# Patient Record
Sex: Male | Born: 1980
Health system: Southern US, Community
[De-identification: ages and names within clinical notes are randomized; demographics above are authoritative.]

## PROBLEM LIST (undated history)

## (undated) DIAGNOSIS — K219 Gastro-esophageal reflux disease without esophagitis: Secondary | ICD-10-CM

## (undated) DIAGNOSIS — J309 Allergic rhinitis, unspecified: Secondary | ICD-10-CM

## (undated) DIAGNOSIS — K601 Chronic anal fissure: Secondary | ICD-10-CM

## (undated) DIAGNOSIS — R0600 Dyspnea, unspecified: Secondary | ICD-10-CM

## (undated) DIAGNOSIS — J189 Pneumonia, unspecified organism: Secondary | ICD-10-CM

## (undated) DIAGNOSIS — Z8489 Family history of other specified conditions: Secondary | ICD-10-CM

## (undated) DIAGNOSIS — Z87898 Personal history of other specified conditions: Secondary | ICD-10-CM

## (undated) HISTORY — PX: ADENOIDECTOMY: SUR15

## (undated) HISTORY — PX: TONSILLECTOMY: SUR1361

## (undated) HISTORY — PX: COLONOSCOPY: SHX174

---

## 2018-10-23 DIAGNOSIS — Z713 Dietary counseling and surveillance: Secondary | ICD-10-CM | POA: Diagnosis not present

## 2019-03-03 DIAGNOSIS — Z8701 Personal history of pneumonia (recurrent): Secondary | ICD-10-CM

## 2019-03-03 HISTORY — DX: Personal history of pneumonia (recurrent): Z87.01

## 2019-03-26 ENCOUNTER — Telehealth: Payer: Self-pay | Admitting: Family

## 2019-03-26 ENCOUNTER — Other Ambulatory Visit: Payer: Self-pay

## 2019-03-26 DIAGNOSIS — Z20822 Contact with and (suspected) exposure to covid-19: Secondary | ICD-10-CM

## 2019-03-26 DIAGNOSIS — R6889 Other general symptoms and signs: Secondary | ICD-10-CM | POA: Diagnosis not present

## 2019-03-26 MED ORDER — PROMETHAZINE-DM 6.25-15 MG/5ML PO SYRP: 5.0000 mL | ORAL_SOLUTION | Freq: Four times a day (QID) | ORAL | 0 refills | Status: DC | PRN

## 2019-03-26 NOTE — Progress Notes (Signed)
E-Visit for Corona Virus Screening   Your current symptoms could be consistent with the coronavirus.  Many health care providers can now test patients at their office but not all are.  [facility] has multiple testing sites. For information on our COVID testing locations and hours go to [email or web address]  Please quarantine yourself while awaiting your test results.  We are enrolling you in our [place] for [place] . Daily you will receive a questionnaire within the [place] website. Our COVID 19 response team willl be monitoriing your responses daily.    COVID-19 is a respiratory illness with symptoms that are similar to the flu. Symptoms are typically mild to moderate, but there have been cases of severe illness and death due to the virus. The following symptoms may appear 2-14 days after exposure: . Fever . Cough . Shortness of breath or difficulty breathing . Chills . Repeated shaking with chills . Muscle pain . Headache . Sore throat . New loss of taste or smell . Fatigue . Congestion or runny nose . Nausea or vomiting . Diarrhea  It is vitally important that if you feel that you have an infection such as this virus or any other virus that you stay home and away from places where you may spread it to others.  You should self-quarantine for 14 days if you have symptoms that could potentially be coronavirus or have been in close contact a with a person diagnosed with COVID-19 within the last 2 weeks. You should avoid contact with people age 67 and older.   You should wear a mask or cloth face covering over your nose and mouth if you must be around other people or animals, including pets (even at home). Try to stay at least 6 feet away from other people. This will protect the people around you.  You can use medication such as A prescription cough medication called Phenergan DM 6.25 mg/15 mg. You make take one teaspoon / 5 ml every 4-6 hours as  needed for cough  You may also take acetaminophen (Tylenol) as needed for fever.   Reduce your risk of any infection by using the same precautions used for avoiding the common cold or flu:  Marland Kitchen Wash your hands often with soap and warm water for at least 20 seconds.  If soap and water are not readily available, use an alcohol-based hand sanitizer with at least 60% alcohol.  . If coughing or sneezing, cover your mouth and nose by coughing or sneezing into the elbow areas of your shirt or coat, into a tissue or into your sleeve (not your hands). . Avoid shaking hands with others and consider head nods or verbal greetings only. . Avoid touching your eyes, nose, or mouth with unwashed hands.  . Avoid close contact with people who are sick. . Avoid places or events with large numbers of people in one location, like concerts or sporting events. . Carefully consider travel plans you have or are making. . If you are planning any travel outside or inside the [place], visit the CDC's Travelers' Health webpage for the latest health notices. . If you have some symptoms but not all symptoms, continue to monitor at home and seek medical attention if your symptoms worsen. . If you are having a medical emergency, call 911.  HOME CARE . Only take medications as instructed by your medical team. . Drink plenty of fluids and get plenty of rest. . A steam or ultrasonic humidifier can help if you  have congestion.   GET HELP RIGHT AWAY IF YOU HAVE EMERGENCY WARNING SIGNS** FOR COVID-19. If you or someone is showing any of these signs seek emergency medical care immediately. Call 911 or proceed to your closest emergency facility if: . You develop worsening high fever. . Trouble breathing . Bluish lips or face . Persistent pain or pressure in the chest . New confusion . Inability to wake or stay awake . You cough up blood. . Your symptoms become more severe  **This list is not all possible symptoms. Contact your  medical provider for any symptoms that are sever or concerning to you.   MAKE SURE YOU   Understand these instructions.  Will watch your condition.  Will get help right away if you are not doing well or get worse.  Your e-visit answers were reviewed by a board certified advanced clinical practitioner to complete your personal care plan.  Depending on the condition, your plan could have included both over the counter or prescription medications.  If there is a problem please reply once you have received a response from your provider.  Your safety is important to [place].  If you have drug allergies check your prescription carefully.    You can use MyChart to ask questions about today's visit, request a non-urgent call back, or ask for a work or school excuse for 24 hours related to this e-Visit. If it has been greater than 24 hours you will need to follow up with your provider, or enter a new e-Visit to address those concerns. You will get an e-mail in the next two days asking about your experience.  I hope that your e-visit has been valuable and will speed your recovery. Thank you for using e-visits.   Greater than 5 minutes, yet less than 10 minutes of time have been spent researching, coordinating, and implementing care for this patient today.  Thank you for the details you included in the comment boxes. Those details are very helpful in determining the best course of treatment for you and help [place] to provide the best care.

## 2019-03-28 LAB — NOVEL CORONAVIRUS, NAA: SARS-CoV-2, NAA: NOT DETECTED

## 2019-03-31 ENCOUNTER — Other Ambulatory Visit: Payer: Self-pay

## 2019-03-31 ENCOUNTER — Ambulatory Visit
Admission: EM | Admit: 2019-03-31 | Discharge: 2019-03-31 | Disposition: A | Discharge status: Home / Self Care | Payer: BC Managed Care – PPO | Attending: Emergency Medicine | Admitting: Emergency Medicine

## 2019-03-31 ENCOUNTER — Ambulatory Visit (INDEPENDENT_AMBULATORY_CARE_PROVIDER_SITE_OTHER): Payer: BC Managed Care – PPO

## 2019-03-31 DIAGNOSIS — R0602 Shortness of breath: Secondary | ICD-10-CM | POA: Diagnosis not present

## 2019-03-31 DIAGNOSIS — R918 Other nonspecific abnormal finding of lung field: Secondary | ICD-10-CM | POA: Diagnosis not present

## 2019-03-31 DIAGNOSIS — R05 Cough: Secondary | ICD-10-CM

## 2019-03-31 DIAGNOSIS — J22 Unspecified acute lower respiratory infection: Secondary | ICD-10-CM | POA: Diagnosis not present

## 2019-03-31 DIAGNOSIS — R6889 Other general symptoms and signs: Secondary | ICD-10-CM | POA: Diagnosis not present

## 2019-03-31 DIAGNOSIS — Z20828 Contact with and (suspected) exposure to other viral communicable diseases: Secondary | ICD-10-CM

## 2019-03-31 DIAGNOSIS — J3489 Other specified disorders of nose and nasal sinuses: Secondary | ICD-10-CM

## 2019-03-31 DIAGNOSIS — Z20822 Contact with and (suspected) exposure to covid-19: Secondary | ICD-10-CM

## 2019-03-31 MED ORDER — BENZONATATE 100 MG PO CAPS: 100.0000 mg | ORAL_CAPSULE | Freq: Three times a day (TID) | ORAL | 0 refills | Status: DC

## 2019-03-31 MED ORDER — AMOXICILLIN 500 MG PO CAPS: 1000.0000 mg | ORAL_CAPSULE | Freq: Three times a day (TID) | ORAL | 0 refills | Status: AC

## 2019-03-31 MED ORDER — AZITHROMYCIN 250 MG PO TABS: 250.0000 mg | ORAL_TABLET | Freq: Every day | ORAL | 0 refills | Status: DC

## 2019-03-31 NOTE — Discharge Instructions (Addendum)
COVID testing ordered.  It will take approximately 5-7 days for results to return.  Chest x-ray concerning for pneumonia.  Amoxicillin and azithromycin prescribed.  Take as directed and to completion.  Radiologist also recommends repeat chest x-ray in 1-2 days to assess for airspace disease.    In the meantime: You should remain isolated in your home for 10 days from symptom onset AND greater than 72 hours after symptoms resolution (absence of fever without the use of fever-reducing medication and improvement in respiratory symptoms), whichever is longer Get plenty of rest and push fluids Tessalon Perles prescribed for cough Continue with promethazine as prescribed for relief of nighttime cough Use OTC zyrtec for nasal congestion, runny nose, and/or sore throat Flonase prescribed for nasal congestion and runny nose Use medications daily for symptom relief Use OTC medications like ibuprofen or tylenol as needed fever or pain Call or go to the ED if you have any new or worsening symptoms such as fever, worsening cough, shortness of breath, chest tightness, chest pain, turning blue, changes in mental status, etc..Marland Kitchen

## 2019-03-31 NOTE — ED Provider Notes (Signed)
[place]   [id number] 03/31/19 Arrival Time: 3546   CC: URI symptoms   SUBJECTIVE: History from: patient.  Jimi Schappert is a 38 y.o. male who presents with sinus congestion x 10 days, and intermittent dry cough x 2-3 days.  Denies sick exposure to COVID, flu or strep.  Admits to recent travel to [place], via car, for a funeral.  Denies sick contacts at the funeral or close contacts with similar symptoms.  Had an e-visit on 03/26/2019 and prescribed promethazine, which helps him sleep.  Negative COVID at that time.  Denies aggravating factors.  Reports previous symptoms in the past with pneumonia.  Reports fever with tmax of 101 for 4-5 days, and chest congestion with possible wheezes.   Denies fever, chills, fatigue, rhinorrhea, sore throat, SOB, chest pain, nausea, changes in bowel or bladder habits.    Denies tobacco use.    ROS: As per HPI.  All other pertinent ROS negative.     History reviewed. No pertinent past medical history. History reviewed. No pertinent surgical history. No Known Allergies No current facility-administered medications on file prior to encounter.    Current Outpatient Medications on File Prior to Encounter  Medication Sig Dispense Refill  . promethazine-dextromethorphan (PROMETHAZINE-DM) 6.25-15 MG/5ML syrup Take 5 mLs by mouth 4 (four) times daily as needed. 118 mL 0   Social History   Socioeconomic History  . Marital status: Married    Spouse name: Not on file  . Number of children: Not on file  . Years of education: Not on file  . Highest education level: Not on file  Occupational History  . Not on file  Social Needs  . Financial resource strain: Not on file  . Food insecurity    Worry: Not on file    Inability: Not on file  . Transportation needs    Medical: Not on file    Non-medical: Not on file  Tobacco Use  . Smoking status: Not on file  Substance and Sexual Activity  . Alcohol use: Not on file  . Drug use: Not on  file  . Sexual activity: Not on file  Lifestyle  . Physical activity    Days per week: Not on file    Minutes per session: Not on file  . Stress: Not on file  Relationships  . Social Herbalist on phone: Not on file    Gets together: Not on file    Attends religious service: Not on file    Active member of club or organization: Not on file    Attends meetings of clubs or organizations: Not on file    Relationship status: Not on file  . Intimate partner violence    Fear of current or ex partner: Not on file    Emotionally abused: Not on file    Physically abused: Not on file    Forced sexual activity: Not on file  Other Topics Concern  . Not on file  Social History Narrative  . Not on file   History reviewed. No pertinent family history.  OBJECTIVE:  Vitals:   03/31/19 1411  BP: 136/83  Pulse: 98  Resp: 20  Temp: 98.3 F (36.8 C)  SpO2: 94%    General appearance: alert; appears mildly fatigued, but nontoxic; speaking in full sentences and tolerating own secretions HEENT: NCAT; Ears: EACs clear, TMs pearly gray; Eyes: PERRL.  EOM grossly intact. Nose: nares patent without rhinorrhea, Throat: oropharynx clear, tonsils non erythematous  or enlarged, uvula midline  Neck: supple without LAD Lungs: unlabored respirations, symmetrical air entry; cough: mild; no respiratory distress; crackles heard over left lung Heart: regular rate and rhythm.   Skin: warm and dry Psychological: alert and cooperative; normal mood and affect  DIAGNOSTIC STUDIES:  Dg Chest 2 View  Result Date: 03/31/2019 CLINICAL DATA:  Cough, shortness of breath, left-sided adventitial breath sounds EXAM: CHEST - 2 VIEW COMPARISON:  None. FINDINGS: The heart size and mediastinal contours are within normal limits. There appears to be minimal heterogeneous airspace opacity of the lingula. The visualized skeletal structures are unremarkable. IMPRESSION: There appears to be minimal heterogeneous airspace  opacity of the lingula, concerning for infection and in keeping with exam findings. Consider follow-up radiographs in 1-2 days to observe for interval evolution of airspace disease. Electronically Signed   By: Eddie Candle M.D.   On: 03/31/2019 14:54    My interpretation:   X-rays negative for pleural effusion, or free air under the diaphragm. Possible infiltrate LLL  I have reviewed the x-rays myself and the radiologist interpretation. I am in agreement with the radiologist interpretation.     ASSESSMENT & PLAN:  1. Lower respiratory tract infection   2. Suspected Covid-19 Virus Infection     Meds ordered this encounter  Medications  . benzonatate (TESSALON) 100 MG capsule    Sig: Take 1 capsule (100 mg total) by mouth every 8 (eight) hours.    Dispense:  21 capsule    Refill:  0    Order Specific Question:   Supervising Provider    Answer:   Raylene Everts [[id number]]  . amoxicillin (AMOXIL) 500 MG capsule    Sig: Take 2 capsules (1,000 mg total) by mouth 3 (three) times daily for 10 days.    Dispense:  60 capsule    Refill:  0    Order Specific Question:   Supervising Provider    Answer:   Raylene Everts [[id number]]  . azithromycin (ZITHROMAX) 250 MG tablet    Sig: Take 1 tablet (250 mg total) by mouth daily. Take first 2 tablets together, then 1 every day until finished.    Dispense:  6 tablet    Refill:  0    Order Specific Question:   Supervising Provider    Answer:   Raylene Everts [[id number]]    COVID testing ordered.  It will take approximately 5-7 days for results to return.  Chest x-ray concerning for pneumonia.  Amoxicillin and azithromycin prescribed.  Take as directed and to completion.  Radiologist also recommends repeat chest x-ray in 1-2 days to assess for airspace disease.    In the meantime: You should remain isolated in your home for 10 days from symptom onset AND greater than 72 hours after symptoms resolution (absence of fever without the use of  fever-reducing medication and improvement in respiratory symptoms), whichever is longer Get plenty of rest and push fluids Tessalon Perles prescribed for cough Continue with promethazine as prescribed for relief of nighttime cough Use OTC zyrtec for nasal congestion, runny nose, and/or sore throat Flonase prescribed for nasal congestion and runny nose Use medications daily for symptom relief Use OTC medications like ibuprofen or tylenol as needed fever or pain Call or go to the ED if you have any new or worsening symptoms such as fever, worsening cough, shortness of breath, chest tightness, chest pain, turning blue, changes in mental status, etc...  Reviewed expectations re: course of current medical issues. Questions answered.  Outlined signs and symptoms indicating need for more acute intervention. Patient verbalized understanding. After Visit Summary given.         Lestine Box, PA-C 03/31/19 1626

## 2019-03-31 NOTE — ED Triage Notes (Signed)
Has had sinus symptoms for past week and now has developed cough, covid test negative

## 2019-04-02 ENCOUNTER — Ambulatory Visit
Admission: EM | Admit: 2019-04-02 | Discharge: 2019-04-02 | Disposition: A | Discharge status: Home / Self Care | Payer: BC Managed Care – PPO | Attending: Emergency Medicine | Admitting: Emergency Medicine

## 2019-04-02 ENCOUNTER — Other Ambulatory Visit: Payer: Self-pay

## 2019-04-02 ENCOUNTER — Encounter (HOSPITAL_COMMUNITY): Payer: Self-pay

## 2019-04-02 ENCOUNTER — Ambulatory Visit (INDEPENDENT_AMBULATORY_CARE_PROVIDER_SITE_OTHER): Payer: BC Managed Care – PPO

## 2019-04-02 DIAGNOSIS — R918 Other nonspecific abnormal finding of lung field: Secondary | ICD-10-CM | POA: Diagnosis not present

## 2019-04-02 DIAGNOSIS — R05 Cough: Secondary | ICD-10-CM | POA: Diagnosis not present

## 2019-04-02 DIAGNOSIS — Z09 Encounter for follow-up examination after completed treatment for conditions other than malignant neoplasm: Secondary | ICD-10-CM

## 2019-04-02 DIAGNOSIS — J181 Lobar pneumonia, unspecified organism: Secondary | ICD-10-CM

## 2019-04-02 DIAGNOSIS — J189 Pneumonia, unspecified organism: Secondary | ICD-10-CM

## 2019-04-02 LAB — NOVEL CORONAVIRUS, NAA: SARS-CoV-2, NAA: NOT DETECTED

## 2019-04-02 MED ORDER — CEFTRIAXONE SODIUM 1 G IJ SOLR
1.0000 g | Freq: Once | INTRAMUSCULAR | Status: AC
  Administered 2019-04-02: 1 g via INTRAMUSCULAR

## 2019-04-02 NOTE — ED Provider Notes (Addendum)
[place]   [id number] 04/02/19 Arrival Time: 3875  Cc: PNA/ CXR follow up  SUBJECTIVE:  Joel Berry is a 38 y.o. male who presents for follow-up chest x-ray.  Was seen on 03/31/19 for URI symptoms.  Had a chest x-ray and diagnosed with pneumonia.  Was started on tessalon, amoxicillin, and z-pak.  COVID test was negative.  Radiologist recommended patient return in 1-2 days to have repeat chest x-ray to assess for airspace disease.  Reports minimal improvement in symptoms with 3 doses of amoxicillin and z-pak.  States he was able to sleep through the night last night, but still has a "rattle" in his chest.  Describes cough as intermittent and dry.  Denies aggravating factors. Reports previous symptoms in the past related to pneumonia.   Complains of associated chest congestion, and wheezing.  Denies fever, chills, fatigue, sinus pain, rhinorrhea, sore throat, SOB, chest pain, nausea, changes in bowel or bladder habits.    ROS: As per HPI.  All other pertinent ROS negative.     History reviewed. No pertinent past medical history. History reviewed. No pertinent surgical history. No Known Allergies No current facility-administered medications on file prior to encounter.    Current Outpatient Medications on File Prior to Encounter  Medication Sig Dispense Refill  . amoxicillin (AMOXIL) 500 MG capsule Take 2 capsules (1,000 mg total) by mouth 3 (three) times daily for 10 days. 60 capsule 0  . azithromycin (ZITHROMAX) 250 MG tablet Take 1 tablet (250 mg total) by mouth daily. Take first 2 tablets together, then 1 every day until finished. 6 tablet 0  . benzonatate (TESSALON) 100 MG capsule Take 1 capsule (100 mg total) by mouth every 8 (eight) hours. 21 capsule 0  . promethazine-dextromethorphan (PROMETHAZINE-DM) 6.25-15 MG/5ML syrup Take 5 mLs by mouth 4 (four) times daily as needed. 118 mL 0    Social History   Socioeconomic History  . Marital status: Married    Spouse  name: Not on file  . Number of children: Not on file  . Years of education: Not on file  . Highest education level: Not on file  Occupational History  . Not on file  Social Needs  . Financial resource strain: Not on file  . Food insecurity    Worry: Not on file    Inability: Not on file  . Transportation needs    Medical: Not on file    Non-medical: Not on file  Tobacco Use  . Smoking status: Not on file  Substance and Sexual Activity  . Alcohol use: Not on file  . Drug use: Not on file  . Sexual activity: Not on file  Lifestyle  . Physical activity    Days per week: Not on file    Minutes per session: Not on file  . Stress: Not on file  Relationships  . Social Herbalist on phone: Not on file    Gets together: Not on file    Attends religious service: Not on file    Active member of club or organization: Not on file    Attends meetings of clubs or organizations: Not on file    Relationship status: Not on file  . Intimate partner violence    Fear of current or ex partner: Not on file    Emotionally abused: Not on file    Physically abused: Not on file    Forced sexual activity: Not on file  Other Topics Concern  . Not  on file  Social History Narrative  . Not on file   History reviewed. No pertinent family history.   OBJECTIVE:  Vitals:   04/02/19 1324  BP: 120/73  Pulse: 74  Resp: 18  Temp: 98.4 F (36.9 C)  SpO2: 95%     General appearance: Alert, appears mildly fatigued, but nontoxic; speaking in full sentences without difficulty HEENT:NCAT; Ears: EACs clear, TMs pearly gray; Eyes: PERRL.  EOM grossly intact. Nose: nares patent without rhinorrhea; Throat: tonsils nonerythematous or enlarged, uvula midline  Neck: supple without LAD Lungs: diffuse rhonchi and crackles heard over LT lung; normal respiratory effort; mild cough present Heart: regular rate and rhythm.   Skin: warm and dry Psychological: alert and cooperative; normal mood and  affect  DIAGNOSTIC STUDIES:  Dg Chest 2 View  Result Date: 04/02/2019 CLINICAL DATA:  Follow-up chest radiograph. Antibiotic treatment for cough. EXAM: CHEST - 2 VIEW COMPARISON:  03/31/2019 FINDINGS: Interstitial type airspace opacities noted in the left upper lobe lingula on the recent prior exam are unchanged. Remainder of the lungs is clear. No pleural effusion or pneumothorax. Heart, mediastinum and hila are unremarkable. Skeletal structures are within normal limits. IMPRESSION: Ill-defined interstitial type opacities in the left upper lobe lingula suggesting pneumonia, unchanged from the most recent prior exam. No new abnormalities. Electronically Signed   By: Lajean Manes M.D.   On: 04/02/2019 14:00    My interpretation:  X-rays stable for left upper lobe pneumonia.   Appears unchanged.    I have reviewed the x-rays myself and the radiologist interpretation. I am in agreement with the radiologist interpretation.     ASSESSMENT & PLAN:  1. Follow up   2. Pneumonia of left upper lobe due to infectious organism [facility])     Meds ordered this encounter  Medications  . cefTRIAXone (ROCEPHIN) injection 1 g    Orders Placed This Encounter  Procedures  . DG Chest 2 View    Standing Status:   Standing    Number of Occurrences:   1    Order Specific Question:   Reason for Exam (SYMPTOM  OR DIAGNOSIS REQUIRED)    Answer:   sob / follow up     Chest x-ray stable.   Ceftriaxone shot given in office Continue with antibiotics as prescribed and to completion   In the meantime: Continue to remain isolated in your home for 10 days from symptom onset AND greater than 72 hours after symptoms resolution (absence of fever without the use of fever-reducing medication and improvement in respiratory symptoms), whichever is longer Get plenty of rest and push fluids Continue with tessalon Perles and promethazine for cough Use OTC medications like ibuprofen or tylenol as needed fever or pain Call  or go to the ED if you have any new or worsening symptoms such as fever, worsening cough, shortness of breath, chest tightness, chest pain, turning blue, changes in mental status, etc...  Discussed with patient to follow up here or with PCP for repeat chest x-ray in 6-10 weeks to assess for underlying malignancy.  Patient aware and in agreement with plan.    Reviewed expectations re: course of current medical issues. Questions answered. Outlined signs and symptoms indicating need for more acute intervention. Patient verbalized understanding. After Visit Summary given.     Lestine Box, PA-C 04/02/19 1433

## 2019-04-02 NOTE — Discharge Instructions (Signed)
Chest x-ray stable.   Ceftriaxone shot given in office Continue with antibiotics as prescribed and to completion   In the meantime: Continue to remain isolated in your home for 10 days from symptom onset AND greater than 72 hours after symptoms resolution (absence of fever without the use of fever-reducing medication and improvement in respiratory symptoms), whichever is longer Get plenty of rest and push fluids Continue with tessalon Perles and promethazine for cough Use OTC medications like ibuprofen or tylenol as needed fever or pain Call or go to the ED if you have any new or worsening symptoms such as fever, worsening cough, shortness of breath, chest tightness, chest pain, turning blue, changes in mental status, etc..Marland Kitchen

## 2019-04-02 NOTE — ED Triage Notes (Signed)
Here for follow up chest x ray

## 2019-04-12 DIAGNOSIS — J189 Pneumonia, unspecified organism: Secondary | ICD-10-CM | POA: Diagnosis not present

## 2019-04-29 ENCOUNTER — Other Ambulatory Visit: Payer: Self-pay

## 2019-04-29 ENCOUNTER — Ambulatory Visit (HOSPITAL_COMMUNITY)
Admission: RE | Admit: 2019-04-29 | Discharge: 2019-04-29 | Disposition: A | Discharge status: Home / Self Care | Payer: BC Managed Care – PPO | Source: Ambulatory Visit | Attending: Family Medicine | Admitting: Family Medicine

## 2019-04-29 ENCOUNTER — Other Ambulatory Visit (HOSPITAL_COMMUNITY): Payer: Self-pay | Admitting: Family Medicine

## 2019-04-29 DIAGNOSIS — J189 Pneumonia, unspecified organism: Secondary | ICD-10-CM

## 2019-04-29 DIAGNOSIS — R918 Other nonspecific abnormal finding of lung field: Secondary | ICD-10-CM | POA: Diagnosis not present

## 2019-11-23 DIAGNOSIS — J310 Chronic rhinitis: Secondary | ICD-10-CM | POA: Diagnosis not present

## 2019-11-23 DIAGNOSIS — J343 Hypertrophy of nasal turbinates: Secondary | ICD-10-CM | POA: Diagnosis not present

## 2019-11-23 DIAGNOSIS — J3503 Chronic tonsillitis and adenoiditis: Secondary | ICD-10-CM | POA: Diagnosis not present

## 2019-12-02 HISTORY — PX: ADENOIDECTOMY: SUR15

## 2019-12-02 HISTORY — PX: TONSILLECTOMY: SUR1361

## 2019-12-02 HISTORY — PX: TONSILLECTOMY AND ADENOIDECTOMY: SUR1326

## 2019-12-10 DIAGNOSIS — Z01818 Encounter for other preprocedural examination: Secondary | ICD-10-CM | POA: Diagnosis not present

## 2019-12-16 DIAGNOSIS — J3503 Chronic tonsillitis and adenoiditis: Secondary | ICD-10-CM | POA: Diagnosis not present

## 2019-12-16 DIAGNOSIS — J353 Hypertrophy of tonsils with hypertrophy of adenoids: Secondary | ICD-10-CM | POA: Diagnosis not present

## 2019-12-16 DIAGNOSIS — J3501 Chronic tonsillitis: Secondary | ICD-10-CM | POA: Diagnosis not present

## 2019-12-16 DIAGNOSIS — J312 Chronic pharyngitis: Secondary | ICD-10-CM | POA: Diagnosis not present

## 2020-03-26 ENCOUNTER — Emergency Department (HOSPITAL_COMMUNITY): Payer: BC Managed Care – PPO

## 2020-03-26 ENCOUNTER — Other Ambulatory Visit: Payer: Self-pay

## 2020-03-26 ENCOUNTER — Encounter (HOSPITAL_COMMUNITY): Payer: Self-pay | Admitting: Emergency Medicine

## 2020-03-26 ENCOUNTER — Emergency Department (HOSPITAL_COMMUNITY)
Admission: EM | Admit: 2020-03-26 | Discharge: 2020-03-26 | Disposition: A | Discharge status: Home / Self Care | Payer: BC Managed Care – PPO | Attending: Emergency Medicine | Admitting: Emergency Medicine

## 2020-03-26 DIAGNOSIS — R0789 Other chest pain: Secondary | ICD-10-CM | POA: Insufficient documentation

## 2020-03-26 DIAGNOSIS — R0602 Shortness of breath: Secondary | ICD-10-CM

## 2020-03-26 DIAGNOSIS — Z87898 Personal history of other specified conditions: Secondary | ICD-10-CM

## 2020-03-26 DIAGNOSIS — Z20822 Contact with and (suspected) exposure to covid-19: Secondary | ICD-10-CM | POA: Insufficient documentation

## 2020-03-26 DIAGNOSIS — R079 Chest pain, unspecified: Secondary | ICD-10-CM | POA: Diagnosis not present

## 2020-03-26 DIAGNOSIS — J90 Pleural effusion, not elsewhere classified: Secondary | ICD-10-CM | POA: Diagnosis not present

## 2020-03-26 HISTORY — DX: Pneumonia, unspecified organism: J18.9

## 2020-03-26 HISTORY — DX: Personal history of other specified conditions: Z87.898

## 2020-03-26 LAB — CBC
HCT: 42.6 % (ref 39.0–52.0)
Hemoglobin: 14.8 g/dL (ref 13.0–17.0)
MCH: 30.6 pg (ref 26.0–34.0)
MCHC: 34.7 g/dL (ref 30.0–36.0)
MCV: 88.2 fL (ref 80.0–100.0)
Platelets: 246 10*3/uL (ref 150–400)
RBC: 4.83 MIL/uL (ref 4.22–5.81)
RDW: 12.7 % (ref 11.5–15.5)
WBC: 7.1 10*3/uL (ref 4.0–10.5)
nRBC: 0 % (ref 0.0–0.2)

## 2020-03-26 LAB — BASIC METABOLIC PANEL
Anion gap: 10 (ref 5–15)
BUN: 15 mg/dL (ref 6–20)
CO2: 27 mmol/L (ref 22–32)
Calcium: 9.6 mg/dL (ref 8.9–10.3)
Chloride: 103 mmol/L (ref 98–111)
Creatinine, Ser: 1.11 mg/dL (ref 0.61–1.24)
GFR calc Af Amer: >60 mL/min (ref >60)
GFR calc non Af Amer: >60 mL/min (ref >60)
Glucose, Bld: 101 mg/dL — ABNORMAL HIGH (ref 70–99)
Potassium: 3.8 mmol/L (ref 3.5–5.1)
Sodium: 140 mmol/L (ref 135–145)

## 2020-03-26 LAB — TROPONIN I (HIGH SENSITIVITY)
Troponin I (High Sensitivity): 3 ng/L (ref <18)
Troponin I (High Sensitivity): 2 ng/L (ref <18)

## 2020-03-26 LAB — SARS CORONAVIRUS 2 BY RT PCR (HOSPITAL ORDER, PERFORMED IN ~~LOC~~ HOSPITAL LAB): SARS Coronavirus 2: NEGATIVE

## 2020-03-26 MED ORDER — PREDNISONE 10 MG (21) PO TBPK: ORAL_TABLET | ORAL | 0 refills | Status: DC

## 2020-03-26 MED ORDER — METHYLPREDNISOLONE SODIUM SUCC 125 MG IJ SOLR
125.0000 mg | Freq: Once | INTRAMUSCULAR | Status: AC
  Administered 2020-03-26: 125 mg via INTRAVENOUS
  Filled 2020-03-26: qty 2

## 2020-03-26 MED ORDER — IOHEXOL 350 MG/ML SOLN
100.0000 mL | Freq: Once | INTRAVENOUS | Status: AC | PRN
  Administered 2020-03-26: 100 mL via INTRAVENOUS

## 2020-03-26 MED ORDER — AEROCHAMBER PLUS FLO-VU MEDIUM MISC
1.0000 | Freq: Once | Status: AC
  Administered 2020-03-26: 1
  Filled 2020-03-26: qty 1

## 2020-03-26 MED ORDER — ALBUTEROL SULFATE HFA 108 (90 BASE) MCG/ACT IN AERS
2.0000 | INHALATION_SPRAY | Freq: Once | RESPIRATORY_TRACT | Status: AC
  Administered 2020-03-26: 2 via RESPIRATORY_TRACT
  Filled 2020-03-26: qty 6.7

## 2020-03-26 NOTE — ED Triage Notes (Signed)
Patient c/o left side chest pressure x3 days. Patient states shortness of breath and weakness. Patient sent here by Urgent Care. Per patient EKG normal. Denies any cardiac hx.

## 2020-03-26 NOTE — ED Notes (Signed)
Pt returned from CT °

## 2020-03-26 NOTE — ED Provider Notes (Signed)
[facility] EMERGENCY DEPARTMENT Provider Note   CSN: [id number] Arrival date & time: 03/26/20  1716     History Chief Complaint  Patient presents with  . Chest Pain    Joel Berry is a 39 y.o. male.  Pt presents to the ED today with feeling like he can't catch his breath.  Pt said it's been going on for a few days.  It was worse last night.  He went to UC who sent him here.  Pt does have some left sided chest pain.  He denies f/c.  He has no risk factors for CAD or PE.  Pt has not been vaccinated against Covid.  No known covid exposures.         Past Medical History:  Diagnosis Date  . Pneumonia     There are no problems to display for this patient.   Past Surgical History:  Procedure Laterality Date  . ADENOIDECTOMY    . TONSILLECTOMY         Family History  Problem Relation Age of Onset  . Cancer Other     Social History   Tobacco Use  . Smoking status: Never Smoker  . Smokeless tobacco: Never Used  Vaping Use  . Vaping Use: Never used  Substance Use Topics  . Alcohol use: Yes    Comment: occasional  . Drug use: Never    Home Medications Prior to Admission medications   Medication Sig Start Date End Date Taking? Authorizing Provider  predniSONE (STERAPRED UNI-PAK 21 TAB) 10 MG (21) TBPK tablet Take 6 tabs for 2 days, then 5 for 2 days, then 4 for 2 days, then 3 for 2 days, 2 for 2 days, then 1 for 2 days 03/26/20   Isla Pence, MD    Allergies    Patient has no known allergies.  Review of Systems   Review of Systems  Respiratory: Positive for shortness of breath.   Cardiovascular: Positive for chest pain.  All other systems reviewed and are negative.   Physical Exam Updated Vital Signs BP 119/84   Pulse 65   Temp 98.6 F (37 C) (Oral)   Resp 17   Ht 5' 8.5" (1.74 m)   Wt 86.2 kg   SpO2 97%   BMI 28.47 kg/m   Physical Exam Vitals and nursing note reviewed.  Constitutional:      Appearance: He is well-developed.  HENT:       Head: Normocephalic and atraumatic.  Eyes:     Extraocular Movements: Extraocular movements intact.     Pupils: Pupils are equal, round, and reactive to light.  Cardiovascular:     Rate and Rhythm: Normal rate and regular rhythm.     Heart sounds: Normal heart sounds.  Pulmonary:     Effort: Pulmonary effort is normal.     Breath sounds: Normal breath sounds.  Abdominal:     General: Bowel sounds are normal.     Palpations: Abdomen is soft.  Musculoskeletal:        General: Normal range of motion.     Cervical back: Normal range of motion and neck supple.  Skin:    General: Skin is warm and dry.     Capillary Refill: Capillary refill takes less than 2 seconds.  Neurological:     General: No focal deficit present.     Mental Status: He is alert and oriented to person, place, and time.  Psychiatric:        Mood and  Affect: Mood normal.        Behavior: Behavior normal.     ED Results / Procedures / Treatments   Labs (all labs ordered are listed, but only abnormal results are displayed) Labs Reviewed  BASIC METABOLIC PANEL - Abnormal; Notable for the following components:      Result Value   Glucose, Bld 101 (*)    All other components within normal limits  SARS CORONAVIRUS 2 BY RT PCR (HOSPITAL ORDER, [facility])  CBC  TROPONIN I (HIGH SENSITIVITY)  TROPONIN I (HIGH SENSITIVITY)    EKG EKG Interpretation  Date/Time:  Sunday March 26 2020 17:37:01 EDT Ventricular Rate:  67 PR Interval:  142 QRS Duration: 92 QT Interval:  390 QTC Calculation: 412 R Axis:   62 Text Interpretation: Normal sinus rhythm Normal ECG No old tracing to compare Confirmed by Isla Pence [phone]) on 03/26/2020 5:42:40 PM   Radiology DG Chest 2 View  Result Date: 03/26/2020 CLINICAL DATA:  39 year old male with history of chest pain. EXAM: CHEST - 2 VIEW COMPARISON:  Chest x-ray 04/29/2019. FINDINGS: Lung volumes are normal. No consolidative airspace disease.  No pleural effusions. No pneumothorax. No pulmonary nodule or mass noted. Pulmonary vasculature and the cardiomediastinal silhouette are within normal limits. IMPRESSION: No radiographic evidence of acute cardiopulmonary disease. Electronically Signed   By: Vinnie Langton M.D.   On: 03/26/2020 18:42   CT Angio Chest PE W and/or Wo Contrast  Result Date: 03/26/2020 CLINICAL DATA:  Chest pressure.  Shortness of breath. EXAM: CT ANGIOGRAPHY CHEST WITH CONTRAST TECHNIQUE: Multidetector CT imaging of the chest was performed using the standard protocol during bolus administration of intravenous contrast. Multiplanar CT image reconstructions and MIPs were obtained to evaluate the vascular anatomy. CONTRAST:  155mL OMNIPAQUE IOHEXOL 350 MG/ML SOLN COMPARISON:  None. FINDINGS: Cardiovascular: Contrast injection is sufficient to demonstrate satisfactory opacification of the pulmonary arteries to the segmental level. There is no pulmonary embolus or evidence of right heart strain. The size of the main pulmonary artery is normal. Mild cardiomegaly. The course and caliber of the aorta are normal. There is no atherosclerotic calcification. Opacification decreased due to pulmonary arterial phase contrast bolus timing. Mediastinum/Nodes: --there are calcified mediastinal lymph nodes. -- No hilar lymphadenopathy. -- No axillary lymphadenopathy. -- No supraclavicular lymphadenopathy. -- Normal thyroid gland where visualized. -  Unremarkable esophagus. Lungs/Pleura: Airways are patent. No pleural effusion, lobar consolidation, pneumothorax or pulmonary infarction. Upper Abdomen: Contrast bolus timing is not optimized for evaluation of the abdominal organs. There is hepatic steatosis. There is a right hepatic lobe cyst. Musculoskeletal: No chest wall abnormality. No bony spinal canal stenosis. Review of the MIP images confirms the above findings. IMPRESSION: 1. No evidence of pulmonary embolism or other acute intrathoracic  process. 2. Mild cardiomegaly. 3. Hepatic steatosis. Electronically Signed   By: Constance Holster M.D.   On: 03/26/2020 22:44    Procedures Procedures (including critical care time)  Medications Ordered in ED Medications  methylPREDNISolone sodium succinate (SOLU-MEDROL) 125 mg/2 mL injection 125 mg (has no administration in time range)  albuterol (VENTOLIN HFA) 108 (90 Base) MCG/ACT inhaler 2 puff (2 puffs Inhalation Given 03/26/20 2103)  AeroChamber Plus Flo-Vu Medium MISC 1 each (1 each Other Given 03/26/20 2103)  iohexol (OMNIPAQUE) 350 MG/ML injection 100 mL (100 mLs Intravenous Contrast Given 03/26/20 2220)    ED Course  I have reviewed the triage vital signs and the nursing notes.  Pertinent labs & imaging results that were  available during my care of the patient were reviewed by me and considered in my medical decision making (see chart for details).    MDM Rules/Calculators/A&P                         Pt's CT for PE neg.  Cardiac work up neg.  He is a heart score of 0.  Pt given an albuterol inhaler and spacer and a rx for prednisone.  Return if worse.  Final Clinical Impression(s) / ED Diagnoses Final diagnoses:  Atypical chest pain  Shortness of breath    Rx / DC Orders ED Discharge Orders         Ordered    predniSONE (STERAPRED UNI-PAK 21 TAB) 10 MG (21) TBPK tablet     Discontinue  Reprint     03/26/20 2315           Isla Pence, MD 03/26/20 2318

## 2020-04-19 ENCOUNTER — Other Ambulatory Visit: Payer: Self-pay | Admitting: Family Medicine

## 2020-04-19 DIAGNOSIS — K648 Other hemorrhoids: Secondary | ICD-10-CM

## 2020-04-21 ENCOUNTER — Encounter: Payer: Self-pay | Admitting: Internal Medicine

## 2020-05-16 ENCOUNTER — Other Ambulatory Visit: Payer: Self-pay

## 2020-05-16 ENCOUNTER — Encounter: Payer: Self-pay | Admitting: General Surgery

## 2020-05-16 ENCOUNTER — Ambulatory Visit (INDEPENDENT_AMBULATORY_CARE_PROVIDER_SITE_OTHER): Payer: BC Managed Care – PPO | Admitting: General Surgery

## 2020-05-16 VITALS — BP 121/75 | HR 65 | Temp 98.4°F | Resp 14 | Ht 68.0 in | Wt 199.0 lb

## 2020-05-16 DIAGNOSIS — K625 Hemorrhage of anus and rectum: Secondary | ICD-10-CM | POA: Diagnosis not present

## 2020-05-16 DIAGNOSIS — K602 Anal fissure, unspecified: Secondary | ICD-10-CM

## 2020-05-16 DIAGNOSIS — K641 Second degree hemorrhoids: Secondary | ICD-10-CM

## 2020-05-16 NOTE — Progress Notes (Signed)
[facility] History and Physical  Reason for Referral: Hemorrhoids  Referring Physician: Leslie Andrea, MD  Chief Complaint    New Patient (Initial Visit)      Joel Berry is a 39 y.o. male.  HPI: Joel Berry is a very sweet 39 yo who says he has had some hemorrhoids he thinks since about 2006. 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 He mostly has regular BMs multiple times a day and says that they are normally formed but on occasion he will have a hard stool that is painful. he describes some bleeding with BMs. He says that he does have itching and feelings of moisture and says he did feel a lump/ bump hanging out one time. He has never been evaluated for his hemorrhoids prior. His mother and grandmother both had colon cancer around 29. He has been told in the past to have a colonoscopy around 42 but has not discussed anything sooner given his bleeding.  He says that the other day he had a hard stool that felt really sharp and tearing coming out. He is otherwise pretty healthy.   Past Medical History:  Diagnosis Date  . Pneumonia     Past Surgical History:  Procedure Laterality Date  . ADENOIDECTOMY    . TONSILLECTOMY      Family History  Problem Relation Age of Onset  . Cancer Other   . Colon cancer Mother   . Colon cancer Maternal Grandmother     Social History   Tobacco Use  . Smoking status: Never Smoker  . Smokeless tobacco: Never Used  Vaping Use  . Vaping Use: Never used  Substance Use Topics  . Alcohol use: Yes    Comment: occasional  . Drug use: Never    Medications: I have reviewed the patient's current medications. Allergies as of 05/16/2020   No Known Allergies     Medication List       Accurate as of May 16, 2020  2:38 PM. If you have any questions, ask your nurse or doctor.        STOP taking these medications   predniSONE 10 MG (21) Tbpk  tablet Commonly known as: STERAPRED UNI-PAK 21 TAB Stopped by: Joel Cagey, MD        ROS:  A comprehensive review of systems was negative except for: Ears, nose, mouth, throat, and face: positive for sinus problems Gastrointestinal: positive for reflux symptoms and itching, moisture of anus, bleeding bright red blood  Blood pressure 121/75, pulse 65, temperature 98.4 F (36.9 C), temperature source Oral, resp. rate 14, height 5\' 8"  (1.727 m), weight 199 lb (90.3 kg), SpO2 98 %. Physical Exam Vitals reviewed.  Constitutional:      Appearance: Normal appearance.  HENT:     Head: Normocephalic and atraumatic.     Nose: Nose normal.  Eyes:     Extraocular Movements: Extraocular movements intact.  Cardiovascular:     Rate and Rhythm: Normal rate and regular rhythm.  Pulmonary:     Effort: Pulmonary effort is normal.     Breath sounds: Normal breath sounds.  Abdominal:     General: There is no distension.     Palpations: Abdomen is soft.     Tenderness: There is no abdominal tenderness.  Genitourinary:    Rectum: Tenderness, anal fissure and internal hemorrhoid present. No mass or external hemorrhoid. Normal anal tone.  Comments: Normal tone, some tenderness posteriorly, fullness consistent with some hemorrhoid tissue, chronic appearing fissure with rolled edges posteriorly  Musculoskeletal:        General: Normal range of motion.     Cervical back: Normal range of motion.  Skin:    General: Skin is warm.  Neurological:     General: No focal deficit present.     Mental Status: He is alert and oriented to person, place, and time.  Psychiatric:        Mood and Affect: Mood normal.        Behavior: Behavior normal.        Thought Content: Thought content normal.        Judgment: Judgment normal.     Results: None  Assessment & Plan:  Joel Berry is a 39 y.o. 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 He has no external tags. He has rectal bleeding and a family history for colon cancer at at young age. He needs colonoscopy given his bleeding to ensure nothing else more concerning going on. It is likely his fissure/ hemorrhoids but this cannot be assumed.   -Will get in touch with Dr. Abbey Chatters who is seeing patient for GERD next week  -Diltiazem 2%/ Liodcaine 5% QID sent in to [facility]  -Will see back in a few weeks   Future Appointments  Date Time Provider [place]  05/19/2020  8:30 AM Valentina Shaggy, MD [facility] None  05/24/2020  2:30 PM Eloise Harman, DO [facility]  06/13/2020  9:45 AM Joel Cagey, MD [facility] None     Joel Berry 05/16/2020, 2:38 PM

## 2020-05-16 NOTE — Patient Instructions (Signed)
Joel Berry for [employer] in - Diltiazem/ Lidocaine   Anal Fissure, Adult  An anal fissure is a small tear or crack in the tissue of the anus. Bleeding from a fissure usually stops on its own within a few minutes. However, bleeding will often occur again with each bowel movement until the fissure heals. What are the causes? This condition is usually caused by passing a large or hard stool (feces). Other causes include:  Constipation.  Frequent diarrhea.  Inflammatory bowel disease (Crohn's disease or ulcerative colitis).  Childbirth.  Infections.  Anal sex. What are the signs or symptoms? Symptoms of this condition include:  Bleeding from the rectum.  Small amounts of blood seen on your stool, on the toilet paper, or in the toilet after a bowel movement. The blood coats the outside of the stool and is not mixed with the stool.  Painful bowel movements.  Itching or irritation around the anus. How is this diagnosed? A health care provider may diagnose this condition by closely examining the anal area. An anal fissure can usually be seen with careful inspection. In some cases, a rectal exam may be performed, or a short tube (anoscope) may be used to examine the anal canal. How is this treated? Initial treatment for this condition may include:  Taking steps to avoid constipation. This may include making changes to your diet, such as increasing your intake of fiber or fluid.  Taking fiber supplements. These supplements can soften your stool to help make bowel movements easier. Your health care provider may also prescribe a stool softener if your stool is hard.  Taking sitz baths. This may help to heal the tear.  Using medicated creams or ointments. These may be prescribed to lessen discomfort. Treatments that are sometimes used if initial treatments do not work well or if the condition is more severe may include:  Botulinum injection.  Surgery to repair  the fissure. Follow these instructions at home: Eating and drinking   Avoid foods that may cause constipation, such as bananas, milk, and other dairy products.  Eat all fruits, except bananas.  Drink enough fluid to keep your urine pale yellow.  Eat foods that are high in fiber, such as beans, whole grains, and fresh fruits and vegetables. General instructions   Take over-the-counter and prescription medicines only as told by your health care provider.  Use creams or ointments only as told by your health care provider.  Keep the anal area clean and dry.  Take sitz baths as told by your health care provider. Do not use soap in the sitz baths.  Keep all follow-up visits as told by your health care provider. This is important. Contact a health care provider if you have:  More bleeding.  A fever.  Diarrhea that is mixed with blood.  Pain that continues.  Ongoing problems that are getting worse rather than better. Summary  An anal fissure is a small tear or crack in the tissue of the anus. This condition is usually caused by passing a large or hard stool (feces). Other causes include constipation and frequent diarrhea.  Initial treatment for this condition may include taking steps to avoid constipation, such as increasing your intake of fiber or fluid.  Follow instructions for care as told by your health care provider.  Contact your health care provider if you have more bleeding or your problem is getting worse rather than better.  Keep all follow-up visits as told by your health care provider.  This is important. This information is not intended to replace advice given to you by your health care provider. Make sure you discuss any questions you have with your health care provider. Document Revised: 01/29/2018 Document Reviewed: 01/29/2018 Elsevier Patient Education  2020 [employer].   Hemorrhoids Hemorrhoids are swollen veins in and around the rectum or anus. There  are two types of hemorrhoids:  Internal hemorrhoids. These occur in the veins that are just inside the rectum. They may poke through to the outside and become irritated and painful.  External hemorrhoids. These occur in the veins that are outside the anus and can be felt as a painful swelling or hard lump near the anus. Most hemorrhoids do not cause serious problems, and they can be managed with home treatments such as diet and lifestyle changes. If home treatments do not help the symptoms, procedures can be done to shrink or remove the hemorrhoids. What are the causes? This condition is caused by increased pressure in the anal area. This pressure may result from various things, including:  Constipation.  Straining to have a bowel movement.  Diarrhea.  Pregnancy.  Obesity.  Sitting for long periods of time.  Heavy lifting or other activity that causes you to strain.  Anal sex.  Riding a bike for a long period of time. What are the signs or symptoms? Symptoms of this condition include:  Pain.  Anal itching or irritation.  Rectal bleeding.  Leakage of stool (feces).  Anal swelling.  One or more lumps around the anus. How is this diagnosed? This condition can often be diagnosed through a visual exam. Other exams or tests may also be done, such as:  An exam that involves feeling the rectal area with a gloved hand (digital rectal exam).  An exam of the anal canal that is done using a small tube (anoscope).  A blood test, if you have lost a significant amount of blood.  A test to look inside the colon using a flexible tube with a camera on the end (sigmoidoscopy or colonoscopy). How is this treated? This condition can usually be treated at home. However, various procedures may be done if dietary changes, lifestyle changes, and other home treatments do not help your symptoms. These procedures can help make the hemorrhoids smaller or remove them completely. Some of these  procedures involve surgery, and others do not. Common procedures include:  Rubber band ligation. Rubber bands are placed at the base of the hemorrhoids to cut off their blood supply.  Sclerotherapy. Medicine is injected into the hemorrhoids to shrink them.  Infrared coagulation. A type of light energy is used to get rid of the hemorrhoids.  Hemorrhoidectomy surgery. The hemorrhoids are surgically removed, and the veins that supply them are tied off.  Stapled hemorrhoidopexy surgery. The surgeon staples the base of the hemorrhoid to the rectal wall. Follow these instructions at home: Eating and drinking   Eat foods that have a lot of fiber in them, such as whole grains, beans, nuts, fruits, and vegetables.  Ask your health care provider about taking products that have added fiber (fiber supplements).  Reduce the amount of fat in your diet. You can do this by eating low-fat dairy products, eating less red meat, and avoiding processed foods.  Drink enough fluid to keep your urine pale yellow. Managing pain and swelling   Take warm sitz baths for 20 minutes, 3-4 times a day to ease pain and discomfort. You may do this in a bathtub  or using a portable sitz bath that fits over the toilet.  If directed, apply ice to the affected area. Using ice packs between sitz baths may be helpful. ? Put ice in a plastic bag. ? Place a towel between your skin and the bag. ? Leave the ice on for 20 minutes, 2-3 times a day. General instructions  Take over-the-counter and prescription medicines only as told by your health care provider.  Use medicated creams or suppositories as told.  Get regular exercise. Ask your health care provider how much and what kind of exercise is best for you. In general, you should do moderate exercise for at least 30 minutes on most days of the week (150 minutes each week). This can include activities such as walking, biking, or yoga.  Go to the bathroom when you have the  urge to have a bowel movement. Do not wait.  Avoid straining to have bowel movements.  Keep the anal area dry and clean. Use wet toilet paper or moist towelettes after a bowel movement.  Do not sit on the toilet for long periods of time. This increases blood pooling and pain.  Keep all follow-up visits as told by your health care provider. This is important. Contact a health care provider if you have:  Increasing pain and swelling that are not controlled by treatment or medicine.  Difficulty having a bowel movement, or you are unable to have a bowel movement.  Pain or inflammation outside the area of the hemorrhoids. Get help right away if you have:  Uncontrolled bleeding from your rectum. Summary  Hemorrhoids are swollen veins in and around the rectum or anus.  Most hemorrhoids can be managed with home treatments such as diet and lifestyle changes.  Taking warm sitz baths can help ease pain and discomfort.  In severe cases, procedures or surgery can be done to shrink or remove the hemorrhoids. This information is not intended to replace advice given to you by your health care provider. Make sure you discuss any questions you have with your health care provider. Document Revised: 01/15/2019 Document Reviewed: 01/08/2018 Elsevier Patient Education  [place].   How to Take a [employer] A sitz bath is a warm water bath that may be used to care for your rectum, genital area, or the area between your rectum and genitals (perineum). For a sitz bath, the water only comes up to your hips and covers your buttocks. A sitz bath may done at home in a bathtub or with a portable sitz bath that fits over the toilet. Your health care provider may recommend a sitz bath to help:  Relieve pain and discomfort after delivering a baby.  Relieve pain and itching from hemorrhoids or anal fissures.  Relieve pain after certain surgeries.  Relax muscles that are sore or tight. How to take a  sitz bath Take 3-4 sitz baths a day, or as many as told by your health care provider. Bathtub sitz bath To take a sitz bath in a bathtub: 1. Partially fill a bathtub with warm water. The water should be deep enough to cover your hips and buttocks when you are sitting in the tub. 2. If your health care provider told you to put medicine in the water, follow his or her instructions. 3. Sit in the water. 4. Open the tub drain a little, and leave it open during your bath. 5. Turn on the warm water again, enough to replace the water that is draining out.  Keep the water running throughout your bath. This helps keep the water at the right level and the right temperature. 6. Soak in the water for 15-20 minutes, or as long as told by your health care provider. 7. When you are done, be careful when you stand up. You may feel dizzy. 8. After the sitz bath, pat yourself dry. Do not rub your skin to dry it.  Over-the-toilet sitz bath To take a sitz bath with an over-the-toilet basin: 1. Follow the manufacturer's instructions. 2. Fill the basin with warm water. 3. If your health care provider told you to put medicine in the water, follow his or her instructions. 4. Sit on the seat. Make sure the water covers your buttocks and perineum. 5. Soak in the water for 15-20 minutes, or as long as told by your health care provider. 6. After the sitz bath, pat yourself dry. Do not rub your skin to dry it. 7. Clean and dry the basin between uses. 8. Discard the basin if it cracks, or according to the manufacturer's instructions. Contact a health care provider if:  Your symptoms get worse. Do not continue with sitz baths if your symptoms get worse.  You have new symptoms. If this happens, do not continue with sitz baths until you talk with your health care provider. Summary  A sitz bath is a warm water bath in which the water only comes up to your hips and covers your buttocks.  A sitz bath may help relieve  itching, relieve pain, and relax muscles that are sore or tight in the lower part of your body, including your genital area.  Take 3-4 sitz baths a day, or as many as told by your health care provider. Soak in the water for 15-20 minutes.  Do not continue with sitz baths if your symptoms get worse. This information is not intended to replace advice given to you by your health care provider. Make sure you discuss any questions you have with your health care provider. Document Revised: 01/18/2019 Document Reviewed: 08/21/2017 Elsevier Patient Education  [place].

## 2020-05-19 ENCOUNTER — Ambulatory Visit (INDEPENDENT_AMBULATORY_CARE_PROVIDER_SITE_OTHER): Payer: BC Managed Care – PPO | Admitting: Allergy & Immunology

## 2020-05-19 ENCOUNTER — Encounter: Payer: Self-pay | Admitting: Allergy & Immunology

## 2020-05-19 ENCOUNTER — Other Ambulatory Visit: Payer: Self-pay

## 2020-05-19 VITALS — BP 120/76 | HR 64 | Resp 18 | Ht 68.0 in | Wt 196.8 lb

## 2020-05-19 DIAGNOSIS — J302 Other seasonal allergic rhinitis: Secondary | ICD-10-CM | POA: Diagnosis not present

## 2020-05-19 DIAGNOSIS — J3089 Other allergic rhinitis: Secondary | ICD-10-CM | POA: Diagnosis not present

## 2020-05-19 DIAGNOSIS — L237 Allergic contact dermatitis due to plants, except food: Secondary | ICD-10-CM | POA: Diagnosis not present

## 2020-05-19 MED ORDER — MONTELUKAST SODIUM 10 MG PO TABS: 10.0000 mg | ORAL_TABLET | Freq: Every day | ORAL | 5 refills | Status: DC

## 2020-05-19 MED ORDER — LEVOCETIRIZINE DIHYDROCHLORIDE 5 MG PO TABS: 5.0000 mg | ORAL_TABLET | Freq: Two times a day (BID) | ORAL | 5 refills | Status: DC

## 2020-05-19 NOTE — Patient Instructions (Addendum)
1. Seasonal and perennial allergic rhinitis - Testing today showed: grasses, ragweed, weeds, trees, dust mites and cockroach - Copy of test results provided.  - Avoidance measures provided. - Start taking: Xyzal (levocetirizine) 5mg  tablet twice daily and Singulair (montelukast) 10mg  daily - You can use an extra dose of the antihistamine, if needed, for breakthrough symptoms.  - Consider nasal saline rinses 1-2 times daily to remove allergens from the nasal cavities as well as help with mucous clearance (this is especially helpful to do before the nasal sprays are given) - Strongly consider allergy shots as a means of long-term control. - Allergy shots "re-train" and "reset" the immune system to ignore environmental allergens and decrease the resulting immune response to those allergens (sneezing, itchy watery eyes, runny nose, nasal congestion, etc).    - Allergy shots improve symptoms in 75-85% of patients.  - Call your insurance company to check on prices.   2. Allergic contact dermatitis due to plants - Add on Eucrisa to help with clearance of the poison ivy.  - This is a non-steroidal and can help with the itching as well. - Samples provided.   3. Return in about 3 months (around 08/18/2020).    Please inform us of any Emergency Department visits, hospitalizations, or changes in symptoms. Call us before going to the ED for breathing or allergy symptoms since we might be able to fit you in for a sick visit. Feel free to contact us anytime with any questions, problems, or concerns.  It was a pleasure to meet you today!  Websites that have reliable patient information: 1. American Academy of Asthma, Allergy, and Immunology: www.aaaai.org 2. Food Allergy Research and Education (FARE): foodallergy.org 3. Mothers of Asthmatics: http://www.asthmacommunitynetwork.org 4. American College of Allergy, Asthma, and Immunology: www.acaai.org   COVID-19 Vaccine Information can be found at:  [email or web address] For questions related to vaccine distribution or appointments, please email [email or web address] or call [phone].     "Like" [place] on Facebook and Instagram for our latest updates!       Make sure you are registered to vote! If you have moved or changed any of your contact information, you will need to get this updated before voting!  In some cases, you MAY be able to register to vote online: [email or web address]     Reducing Pollen Exposure  The American Academy of Allergy, Asthma and Immunology suggests the following steps to reduce your exposure to pollen during allergy seasons.    1. Do not hang sheets or clothing out to dry; pollen may collect on these items. 2. Do not mow lawns or spend time around freshly cut grass; mowing stirs up pollen. 3. Keep windows closed at night.  Keep car windows closed while driving. 4. Minimize morning activities outdoors, a time when pollen counts are usually at their highest. 5. Stay indoors as much as possible when pollen counts or humidity is high and on windy days when pollen tends to remain in the air longer. 6. Use air conditioning when possible.  Many air conditioners have filters that trap the pollen spores. 7. Use a HEPA room air filter to remove pollen form the indoor air you breathe.  Control of Cockroach Allergen  Cockroach allergen has been identified as an important cause of acute attacks of asthma, especially in urban settings.  There are fifty-five species of cockroach that exist in the [place], however only three, the [place], [place] species produce allergen that can affect patients with Asthma.  Allergens can  be obtained from fecal particles, egg casings and secretions from cockroaches.    1. Remove food sources. 2. Reduce access to water. 3. Seal access and entry points. 4. Spray runways with 0.5-1%  Diazinon or Chlorpyrifos 5. Blow boric acid power under stoves and refrigerator. 6. Place bait stations (hydramethylnon) at feeding sites.  Control of Dust Mite Allergen    Dust mites play a major role in allergic asthma and rhinitis.  They occur in environments with high humidity wherever human skin is found.  Dust mites absorb humidity from the atmosphere (ie, they do not drink) and feed on organic matter (including shed human and animal skin).  Dust mites are a microscopic type of insect that you cannot see with the naked eye.  High levels of dust mites have been detected from mattresses, pillows, carpets, upholstered furniture, bed covers, clothes, soft toys and any woven material.  The principal allergen of the dust mite is found in its feces.  A gram of dust may contain 1,000 mites and 250,000 fecal particles.  Mite antigen is easily measured in the air during house cleaning activities.  Dust mites do not bite and do not cause harm to humans, other than by triggering allergies/asthma.    Ways to decrease your exposure to dust mites in your home:  1. Encase mattresses, box springs and pillows with a mite-impermeable barrier or cover   2. Wash sheets, blankets and drapes weekly in hot water (130 F) with detergent and dry them in a dryer on the hot setting.  3. Have the room cleaned frequently with a vacuum cleaner and a damp dust-mop.  For carpeting or rugs, vacuuming with a vacuum cleaner equipped with a high-efficiency particulate air (HEPA) filter.  The dust mite allergic individual should not be in a room which is being cleaned and should wait 1 hour after cleaning before going into the room. 4. Do not sleep on upholstered furniture (eg, couches).   5. If possible removing carpeting, upholstered furniture and drapery from the home is ideal.  Horizontal blinds should be eliminated in the rooms where the person spends the most time (bedroom, study, television room).  Washable vinyl, roller-type  shades are optimal. 6. Remove all non-washable stuffed toys from the bedroom.  Wash stuffed toys weekly like sheets and blankets above.   7. Reduce indoor humidity to less than 50%.  Inexpensive humidity monitors can be purchased at most hardware stores.  Do not use a humidifier as can make the problem worse and are not recommended.  Allergy Shots   Allergies are the result of a chain reaction that starts in the immune system. Your immune system controls how your body defends itself. For instance, if you have an allergy to pollen, your immune system identifies pollen as an invader or allergen. Your immune system overreacts by producing antibodies called Immunoglobulin E (IgE). These antibodies travel to cells that release chemicals, causing an allergic reaction.  The concept behind allergy immunotherapy, whether it is received in the form of shots or tablets, is that the immune system can be desensitized to specific allergens that trigger allergy symptoms. Although it requires time and patience, the payback can be long-term relief.  How Do Allergy Shots Work?  Allergy shots work much like a vaccine. Your body responds to injected amounts of a particular allergen given in increasing doses, eventually developing a resistance and tolerance to it. Allergy shots can lead to decreased, minimal or no allergy symptoms.  There generally are two  phases: build-up and maintenance. Build-up often ranges from three to six months and involves receiving injections with increasing amounts of the allergens. The shots are typically given once or twice a week, though more rapid build-up schedules are sometimes used.  The maintenance phase begins when the most effective dose is reached. This dose is different for each person, depending on how allergic you are and your response to the build-up injections. Once the maintenance dose is reached, there are longer periods between injections, typically two to four  weeks.  Occasionally doctors give cortisone-type shots that can temporarily reduce allergy symptoms. These types of shots are different and should not be confused with allergy immunotherapy shots.  Who Can Be Treated with Allergy Shots?  Allergy shots may be a good treatment approach for people with allergic rhinitis (hay fever), allergic asthma, conjunctivitis (eye allergy) or stinging insect allergy.   Before deciding to begin allergy shots, you should consider:  . The length of allergy season and the severity of your symptoms . Whether medications and/or changes to your environment can control your symptoms . Your desire to avoid long-term medication use . Time: allergy immunotherapy requires a major time commitment . Cost: may vary depending on your insurance coverage  Allergy shots for children age 48 and older are effective and often well tolerated. They might prevent the onset of new allergen sensitivities or the progression to asthma.  Allergy shots are not started on patients who are pregnant but can be continued on patients who become pregnant while receiving them. In some patients with other medical conditions or who take certain common medications, allergy shots may be of risk. It is important to mention other medications you talk to your allergist.   When Will I Feel Better?  Some may experience decreased allergy symptoms during the build-up phase. For others, it may take as long as 12 months on the maintenance dose. If there is no improvement after a year of maintenance, your allergist will discuss other treatment options with you.  If you aren't responding to allergy shots, it may be because there is not enough dose of the allergen in your vaccine or there are missing allergens that were not identified during your allergy testing. Other reasons could be that there are high levels of the allergen in your environment or major exposure to non-allergic triggers like tobacco  smoke.  What Is the Length of Treatment?  Once the maintenance dose is reached, allergy shots are generally continued for three to five years. The decision to stop should be discussed with your allergist at that time. Some people may experience a permanent reduction of allergy symptoms. Others may relapse and a longer course of allergy shots can be considered.  What Are the Possible Reactions?  The two types of adverse reactions that can occur with allergy shots are local and systemic. Common local reactions include very mild redness and swelling at the injection site, which can happen immediately or several hours after. A systemic reaction, which is less common, affects the entire body or a particular body system. They are usually mild and typically respond quickly to medications. Signs include increased allergy symptoms such as sneezing, a stuffy nose or hives.  Rarely, a serious systemic reaction called anaphylaxis can develop. Symptoms include swelling in the throat, wheezing, a feeling of tightness in the chest, nausea or dizziness. Most serious systemic reactions develop within 30 minutes of allergy shots. This is why it is strongly recommended you wait in your doctor's  office for 30 minutes after your injections. Your allergist is trained to watch for reactions, and his or her staff is trained and equipped with the proper medications to identify and treat them.  Who Should Administer Allergy Shots?  The preferred location for receiving shots is your prescribing allergist's office. Injections can sometimes be given at another facility where the physician and staff are trained to recognize and treat reactions, and have received instructions by your prescribing allergist.
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
## 2020-05-24 ENCOUNTER — Ambulatory Visit (INDEPENDENT_AMBULATORY_CARE_PROVIDER_SITE_OTHER): Payer: BC Managed Care – PPO | Admitting: Internal Medicine

## 2020-05-24 ENCOUNTER — Encounter: Payer: Self-pay | Admitting: Internal Medicine

## 2020-05-24 ENCOUNTER — Other Ambulatory Visit: Payer: Self-pay

## 2020-05-24 VITALS — BP 144/85 | HR 92 | Temp 97.0°F | Ht 68.0 in | Wt 199.4 lb

## 2020-05-24 DIAGNOSIS — R490 Dysphonia: Secondary | ICD-10-CM | POA: Diagnosis not present

## 2020-05-24 DIAGNOSIS — K219 Gastro-esophageal reflux disease without esophagitis: Secondary | ICD-10-CM | POA: Diagnosis not present

## 2020-05-24 DIAGNOSIS — K625 Hemorrhage of anus and rectum: Secondary | ICD-10-CM

## 2020-05-24 NOTE — H&P (View-Only) (Signed)
Primary Care Physician:  Leslie Andrea, MD Primary Gastroenterologist:  Dr. Abbey Chatters  Chief Complaint  Patient presents with  . Gastroesophageal Reflux    reflux coming back up in throat  . Consult    TCS never done prior. Mother and MGM hx colon cancer    HPI:   Joel Berry is a 38 y.o. male who presents to the clinic today by referral from his PCP Dr. Karie Kirks for evaluation.  He has multiple GI complaints for me today.  He states he has had chronic reflux for 3 years.  Notes that it worsens depending on what he eats.  Notes tomato based foods and spicy foods as specific triggers.  No overt dysphagia or odynophagia.  He takes Tums regularly with some relief does note some hoarseness in his voice at times.  No chronic NSAID use.  No history of PUD or H. pylori.  Also notes rectal bleeding primarily with bowel movements.  Notes bright red blood both on tissue paper and in the toilet bowl.  Believes he has a hemorrhoid.  Actually saw Dr. Constance Haw with general surgery who diagnosed patient with an anal fissure.  He was prescribed topical diltiazem and lidocaine but states he has not used this.  He does note his pain is improved though he still feels like "something is there."  Also notes family history of colon cancer in his mother and grandmother.  No previous colonoscopy.  No unintentional weight loss.  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  Past Medical History:  Diagnosis Date  . Pneumonia     Past Surgical History:  Procedure Laterality Date  . ADENOIDECTOMY  12/2019  . TONSILLECTOMY  12/2019    Current Outpatient Medications  Medication Sig Dispense Refill  . calcium carbonate (TUMS - DOSED IN MG ELEMENTAL CALCIUM) 500 MG chewable tablet Chew 1 tablet by mouth as needed for indigestion or heartburn.    . levocetirizine (XYZAL) 5 MG tablet Take 1 tablet (5 mg total) by mouth in the morning and at  bedtime. 60 tablet 5  . montelukast (SINGULAIR) 10 MG tablet Take 1 tablet (10 mg total) by mouth at bedtime. 30 tablet 5  . fluticasone (FLONASE) 50 MCG/ACT nasal spray Place 1 spray into both nostrils daily. (Patient not taking: Reported on 05/24/2020)     No current facility-administered medications for this visit.    Allergies as of 05/24/2020  . (No Known Allergies)    Family History  Problem Relation Age of Onset  . Cancer Other   . Colon cancer Mother   . Colon cancer Maternal Grandmother   . Hypertension Father     Social History   Socioeconomic History  . Marital status: Married    Spouse name: Not on file  . Number of children: Not on file  . Years of education: Not on file  . Highest education level: Not on file  Occupational History  . Not on file  Tobacco Use  . Smoking status: Never Smoker  . Smokeless tobacco: Never Used  Vaping Use  . Vaping Use: Never used  Substance and Sexual Activity  . Alcohol use: Yes    Comment: occasional  . Drug use: Never  . Sexual activity: Not on file  Other Topics Concern  . Not on file  Social History Narrative  . Not on file   Social Determinants of Health   Financial Resource Strain:   .  Difficulty of Paying Living Expenses: Not on file  Food Insecurity:   . Worried About Charity fundraiser in the Last Year: Not on file  . Ran Out of Food in the Last Year: Not on file  Transportation Needs:   . Lack of Transportation (Medical): Not on file  . Lack of Transportation (Non-Medical): Not on file  Physical Activity:   . Days of Exercise per Week: Not on file  . Minutes of Exercise per Session: Not on file  Stress:   . Feeling of Stress : Not on file  Social Connections:   . Frequency of Communication with Friends and Family: Not on file  . Frequency of Social Gatherings with Friends and Family: Not on file  . Attends Religious Services: Not on file  . Active Member of Clubs or Organizations: Not on file  .  Attends Archivist Meetings: Not on file  . Marital Status: Not on file  Intimate Partner Violence:   . Fear of Current or Ex-Partner: Not on file  . Emotionally Abused: Not on file  . Physically Abused: Not on file  . Sexually Abused: Not on file    Subjective: Review of Systems  Constitutional: Negative for chills and fever.  HENT: Negative for congestion and hearing loss.   Eyes: Negative for blurred vision and double vision.  Respiratory: Negative for cough and shortness of breath.   Cardiovascular: Negative for chest pain and palpitations.  Gastrointestinal: Positive for heartburn. Negative for abdominal pain, blood in stool, constipation, diarrhea, melena, nausea and vomiting.       Rectal bleeding  Genitourinary: Negative for dysuria and urgency.  Musculoskeletal: Negative for joint pain and myalgias.  Skin: Negative for itching and rash.  Neurological: Negative for dizziness and headaches.  Psychiatric/Behavioral: Negative for depression. The patient is not nervous/anxious.        Objective: BP (!) 144/85   Pulse 92   Temp (!) 97 F (36.1 C) (Oral)   Ht 5\' 8"  (1.727 m)   Wt 199 lb 6.4 oz (90.4 kg)   BMI 30.32 kg/m  Physical Exam Constitutional:      Appearance: Normal appearance.  HENT:     Head: Normocephalic and atraumatic.  Eyes:     Extraocular Movements: Extraocular movements intact.     Conjunctiva/sclera: Conjunctivae normal.  Cardiovascular:     Rate and Rhythm: Normal rate and regular rhythm.  Pulmonary:     Effort: Pulmonary effort is normal.     Breath sounds: Normal breath sounds.  Abdominal:     General: Bowel sounds are normal.     Palpations: Abdomen is soft.  Musculoskeletal:        General: Normal range of motion.     Cervical back: Normal range of motion and neck supple.  Skin:    General: Skin is warm.  Neurological:     General: No focal deficit present.     Mental Status: He is alert and oriented to person, place, and  time.  Psychiatric:        Mood and Affect: Mood normal.        Behavior: Behavior normal.      Assessment: *Chronic reflux-uncontrolled *Rectal bleeding *Anal fissure  Plan: -Will schedule for EGD to evaluate for peptic ulcer disease, esophagitis, gastritis, H. Pylori, duodenitis, or other. Will also evaluate for esophageal stricture, Schatzki's ring, esophageal web or other.   -We will print off information regarding reflux practices at home.    -Will  likely need to start on PPI therapy pending endoscopy results.  -At the same time we will perform colonoscopy given family history of colon cancer and ongoing rectal bleeding.  -If no other identifiable cause of bleeding found, we may recommend hemorrhoid banding.  The risks including infection, bleed, or perforation as well as benefits, limitations, alternatives and imponderables have been reviewed with the patient. Potential for esophageal dilation, biopsy, etc. have also been reviewed.  Questions have been answered. All parties agreeable.  Thank you Dr. Karie Kirks for the kind referral.   05/24/2020 2:45 PM   Disclaimer: This note was dictated with voice recognition software. Similar sounding words can inadvertently be transcribed and may not be corrected upon review.

## 2020-05-24 NOTE — Patient Instructions (Addendum)
We will schedule you for EGD and colonoscopy today in clinic to further evaluate your symptoms.  Depending on results, we will likely start you on medication for your chronic reflux.  I also may recommend hemorrhoid banding depending on colonoscopy.  Further recommendations to follow.  At [facility] we value your feedback. You may receive a survey about your visit today. Please share your experience as we strive to create trusting relationships with our patients to provide genuine, compassionate, quality care.  We appreciate your understanding and patience as we review any laboratory studies, imaging, and other diagnostic tests that are ordered as we care for you. Our office policy is 5 business days for review of these results, and any emergent or urgent results are addressed in a timely manner for your best interest. If you do not hear from our office in 1 week, please contact us.   We also encourage the use of MyChart, which contains your medical information for your review as well. If you are not enrolled in this feature, an access code is on this after visit summary for your convenience. Thank you for allowing [place] to be involved in your care.  It was great to see you today!  I hope you have a great fall!    Elon Alas. Abbey Chatters, D.O. Gastroenterology and Hepatology [facility]

## 2020-05-24 NOTE — Progress Notes (Signed)
Primary Care Physician:  Leslie Andrea, MD Primary Gastroenterologist:  Dr. Abbey Chatters  Chief Complaint  Patient presents with  . Gastroesophageal Reflux    reflux coming back up in throat  . Consult    TCS never done prior. Mother and MGM hx colon cancer    HPI:   Joel Berry is a 39 y.o. male who presents to the clinic today by referral from his PCP Dr. Karie Kirks for evaluation.  He has multiple GI complaints for me today.  He states he has had chronic reflux for 3 years.  Notes that it worsens depending on what he eats.  Notes tomato based foods and spicy foods as specific triggers.  No overt dysphagia or odynophagia.  He takes Tums regularly with some relief does note some hoarseness in his voice at times.  No chronic NSAID use.  No history of PUD or H. pylori.  Also notes rectal bleeding primarily with bowel movements.  Notes bright red blood both on tissue paper and in the toilet bowl.  Believes he has a hemorrhoid.  Actually saw Dr. Constance Haw with general surgery who diagnosed patient with an anal fissure.  He was prescribed topical diltiazem and lidocaine but states he has not used this.  He does note his pain is improved though he still feels like "something is there."  Also notes family history of colon cancer in his mother and grandmother.  No previous colonoscopy.  No unintentional weight loss.  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  Past Medical History:  Diagnosis Date  . Pneumonia     Past Surgical History:  Procedure Laterality Date  . ADENOIDECTOMY  12/2019  . TONSILLECTOMY  12/2019    Current Outpatient Medications  Medication Sig Dispense Refill  . calcium carbonate (TUMS - DOSED IN MG ELEMENTAL CALCIUM) 500 MG chewable tablet Chew 1 tablet by mouth as needed for indigestion or heartburn.    . levocetirizine (XYZAL) 5 MG tablet Take 1 tablet (5 mg total) by mouth in the morning and at  bedtime. 60 tablet 5  . montelukast (SINGULAIR) 10 MG tablet Take 1 tablet (10 mg total) by mouth at bedtime. 30 tablet 5  . fluticasone (FLONASE) 50 MCG/ACT nasal spray Place 1 spray into both nostrils daily. (Patient not taking: Reported on 05/24/2020)     No current facility-administered medications for this visit.    Allergies as of 05/24/2020  . (No Known Allergies)    Family History  Problem Relation Age of Onset  . Cancer Other   . Colon cancer Mother   . Colon cancer Maternal Grandmother   . Hypertension Father     Social History   Socioeconomic History  . Marital status: Married    Spouse name: Not on file  . Number of children: Not on file  . Years of education: Not on file  . Highest education level: Not on file  Occupational History  . Not on file  Tobacco Use  . Smoking status: Never Smoker  . Smokeless tobacco: Never Used  Vaping Use  . Vaping Use: Never used  Substance and Sexual Activity  . Alcohol use: Yes    Comment: occasional  . Drug use: Never  . Sexual activity: Not on file  Other Topics Concern  . Not on file  Social History Narrative  . Not on file   Social Determinants of Health   Financial Resource Strain:   .  Difficulty of Paying Living Expenses: Not on file  Food Insecurity:   . Worried About Charity fundraiser in the Last Year: Not on file  . Ran Out of Food in the Last Year: Not on file  Transportation Needs:   . Lack of Transportation (Medical): Not on file  . Lack of Transportation (Non-Medical): Not on file  Physical Activity:   . Days of Exercise per Week: Not on file  . Minutes of Exercise per Session: Not on file  Stress:   . Feeling of Stress : Not on file  Social Connections:   . Frequency of Communication with Friends and Family: Not on file  . Frequency of Social Gatherings with Friends and Family: Not on file  . Attends Religious Services: Not on file  . Active Member of Clubs or Organizations: Not on file  .  Attends Archivist Meetings: Not on file  . Marital Status: Not on file  Intimate Partner Violence:   . Fear of Current or Ex-Partner: Not on file  . Emotionally Abused: Not on file  . Physically Abused: Not on file  . Sexually Abused: Not on file    Subjective: Review of Systems  Constitutional: Negative for chills and fever.  HENT: Negative for congestion and hearing loss.   Eyes: Negative for blurred vision and double vision.  Respiratory: Negative for cough and shortness of breath.   Cardiovascular: Negative for chest pain and palpitations.  Gastrointestinal: Positive for heartburn. Negative for abdominal pain, blood in stool, constipation, diarrhea, melena, nausea and vomiting.       Rectal bleeding  Genitourinary: Negative for dysuria and urgency.  Musculoskeletal: Negative for joint pain and myalgias.  Skin: Negative for itching and rash.  Neurological: Negative for dizziness and headaches.  Psychiatric/Behavioral: Negative for depression. The patient is not nervous/anxious.        Objective: BP (!) 144/85   Pulse 92   Temp (!) 97 F (36.1 C) (Oral)   Ht 5\' 8"  (1.727 m)   Wt 199 lb 6.4 oz (90.4 kg)   BMI 30.32 kg/m  Physical Exam Constitutional:      Appearance: Normal appearance.  HENT:     Head: Normocephalic and atraumatic.  Eyes:     Extraocular Movements: Extraocular movements intact.     Conjunctiva/sclera: Conjunctivae normal.  Cardiovascular:     Rate and Rhythm: Normal rate and regular rhythm.  Pulmonary:     Effort: Pulmonary effort is normal.     Breath sounds: Normal breath sounds.  Abdominal:     General: Bowel sounds are normal.     Palpations: Abdomen is soft.  Musculoskeletal:        General: Normal range of motion.     Cervical back: Normal range of motion and neck supple.  Skin:    General: Skin is warm.  Neurological:     General: No focal deficit present.     Mental Status: He is alert and oriented to person, place, and  time.  Psychiatric:        Mood and Affect: Mood normal.        Behavior: Behavior normal.      Assessment: *Chronic reflux-uncontrolled *Rectal bleeding *Anal fissure  Plan: -Will schedule for EGD to evaluate for peptic ulcer disease, esophagitis, gastritis, H. Pylori, duodenitis, or other. Will also evaluate for esophageal stricture, Schatzki's ring, esophageal web or other.   -We will print off information regarding reflux practices at home.    -Will  likely need to start on PPI therapy pending endoscopy results.  -At the same time we will perform colonoscopy given family history of colon cancer and ongoing rectal bleeding.  -If no other identifiable cause of bleeding found, we may recommend hemorrhoid banding.  The risks including infection, bleed, or perforation as well as benefits, limitations, alternatives and imponderables have been reviewed with the patient. Potential for esophageal dilation, biopsy, etc. have also been reviewed.  Questions have been answered. All parties agreeable.  Thank you Dr. Karie Kirks for the kind referral.   05/24/2020 2:45 PM   Disclaimer: This note was dictated with voice recognition software. Similar sounding words can inadvertently be transcribed and may not be corrected upon review.

## 2020-05-25 ENCOUNTER — Telehealth: Payer: Self-pay | Admitting: *Deleted

## 2020-05-25 NOTE — Telephone Encounter (Signed)
Called pt. He has been scheduled for TCS/EGD with propofol with Dr. Abbey Chatters 10/8 at 2:00pm. Aware will need covid test prior. Advised will mail instructions. Confirmed mailing address.

## 2020-05-25 NOTE — Telephone Encounter (Signed)
Patient called back. He needs to change the date of procedure. He has been rescheduled to 10/18 at 1:00pm. Aware will send instructions with covid appt

## 2020-06-08 ENCOUNTER — Other Ambulatory Visit (HOSPITAL_COMMUNITY): Payer: BC Managed Care – PPO

## 2020-06-13 ENCOUNTER — Other Ambulatory Visit: Payer: Self-pay

## 2020-06-13 ENCOUNTER — Telehealth: Payer: Self-pay | Admitting: *Deleted

## 2020-06-13 ENCOUNTER — Encounter: Payer: Self-pay | Admitting: General Surgery

## 2020-06-13 ENCOUNTER — Ambulatory Visit (INDEPENDENT_AMBULATORY_CARE_PROVIDER_SITE_OTHER): Payer: BC Managed Care – PPO | Admitting: General Surgery

## 2020-06-13 VITALS — BP 118/80 | HR 56 | Temp 98.1°F | Resp 16 | Ht 68.0 in | Wt 199.0 lb

## 2020-06-13 DIAGNOSIS — K625 Hemorrhage of anus and rectum: Secondary | ICD-10-CM

## 2020-06-13 DIAGNOSIS — K602 Anal fissure, unspecified: Secondary | ICD-10-CM

## 2020-06-13 NOTE — Progress Notes (Signed)
[facility] Note   HPI:  39 y.o. Male presents to clinic for follow-up evaluation of his posterior fissure. He says he has no real discomfort or extreme pain with Bms but does feel a tightness. He says that he has some minor bleeding that is random. He feels a knot in the posterior part of his anus. He has been using the cream regularly.   Review of Systems:  Tightness with defecation  All other review of systems: otherwise negative   Vital Signs:  BP 118/80   Pulse (!) 56   Temp 98.1 F (36.7 C) (Oral)   Resp 16   Ht 5\' 8"  (1.727 m)   Wt 199 lb (90.3 kg)   SpO2 98%   BMI 30.26 kg/m    Physical Exam:  Physical Exam Vitals reviewed.  HENT:     Head: Normocephalic.  Cardiovascular:     Rate and Rhythm: Normal rate.  Pulmonary:     Effort: Pulmonary effort is normal.  Genitourinary:    Comments: Continued posterior fissure with some healing edges but fairly deep into the muscle layer, hard knot at the anal verge, no major skin tag Neurological:     General: No focal deficit present.     Mental Status: He is alert and oriented to person, place, and time.     Assessment:  39 y.o. yo Male with anal fissure that he has been using the topical treatment for about 1 month. He is getting a colonoscopy next week to ensure no other issues or causes of his rectal bleeding as he has a mother with colon cancer. Discussed that if he is refractory to medicine he may need surgery but overall symptoms seem to be improving so may be able to avoid.   Plan:  Continue using Diltiazem/ Lidocaine cream four times a day. The fissure may worsen with colonoscopy. Will see back in a few weeks.   Future Appointments  Date Time Provider [place]  06/16/2020  9:00 AM AP-SCREENING [facility] None  06/27/2020  9:30 AM Virl Cagey, MD [facility] None  08/16/2020  9:50 AM Althea Charon, FNP [facility] None     Curlene Labrum, MD [facility] [address] [place], [place] [address] [phone] (office)

## 2020-06-13 NOTE — Telephone Encounter (Signed)
Called pt. Advised procedure time for Monday has been moved up to 11:30, arrival 10:00am. He voiced understanding

## 2020-06-13 NOTE — Patient Instructions (Signed)
Continue using Diltiazem/ Lidocaine cream four times a day. The fissure may worsen with colonoscopy. Will see back in a few weeks.

## 2020-06-16 ENCOUNTER — Other Ambulatory Visit: Payer: Self-pay

## 2020-06-16 ENCOUNTER — Other Ambulatory Visit (HOSPITAL_COMMUNITY)
Admission: RE | Admit: 2020-06-16 | Discharge: 2020-06-16 | Disposition: A | Discharge status: Home / Self Care | Payer: BC Managed Care – PPO | Source: Ambulatory Visit | Attending: Internal Medicine | Admitting: Internal Medicine

## 2020-06-16 DIAGNOSIS — Z01812 Encounter for preprocedural laboratory examination: Secondary | ICD-10-CM | POA: Insufficient documentation

## 2020-06-16 DIAGNOSIS — Z20822 Contact with and (suspected) exposure to covid-19: Secondary | ICD-10-CM | POA: Insufficient documentation

## 2020-06-16 LAB — SARS CORONAVIRUS 2 (TAT 6-24 HRS): SARS Coronavirus 2: NEGATIVE

## 2020-06-19 ENCOUNTER — Ambulatory Visit (HOSPITAL_COMMUNITY)
Admission: RE | Admit: 2020-06-19 | Discharge: 2020-06-19 | Disposition: A | Discharge status: Home / Self Care | Payer: BC Managed Care – PPO | Attending: Internal Medicine | Admitting: Internal Medicine

## 2020-06-19 ENCOUNTER — Encounter (HOSPITAL_COMMUNITY): Payer: Self-pay | Admitting: *Deleted

## 2020-06-19 ENCOUNTER — Ambulatory Visit (HOSPITAL_COMMUNITY): Payer: BC Managed Care – PPO | Admitting: Anesthesiology

## 2020-06-19 ENCOUNTER — Other Ambulatory Visit: Payer: Self-pay

## 2020-06-19 ENCOUNTER — Encounter (HOSPITAL_COMMUNITY)
Admission: RE | Disposition: A | Discharge status: Home / Self Care | Payer: Self-pay | Source: Home / Self Care | Attending: Internal Medicine

## 2020-06-19 DIAGNOSIS — K625 Hemorrhage of anus and rectum: Secondary | ICD-10-CM | POA: Diagnosis not present

## 2020-06-19 DIAGNOSIS — Z79899 Other long term (current) drug therapy: Secondary | ICD-10-CM | POA: Insufficient documentation

## 2020-06-19 DIAGNOSIS — K2289 Other specified disease of esophagus: Secondary | ICD-10-CM

## 2020-06-19 DIAGNOSIS — K2970 Gastritis, unspecified, without bleeding: Secondary | ICD-10-CM

## 2020-06-19 DIAGNOSIS — K648 Other hemorrhoids: Secondary | ICD-10-CM | POA: Insufficient documentation

## 2020-06-19 DIAGNOSIS — K635 Polyp of colon: Secondary | ICD-10-CM | POA: Diagnosis not present

## 2020-06-19 DIAGNOSIS — K200 Eosinophilic esophagitis: Secondary | ICD-10-CM | POA: Insufficient documentation

## 2020-06-19 DIAGNOSIS — D120 Benign neoplasm of cecum: Secondary | ICD-10-CM | POA: Insufficient documentation

## 2020-06-19 DIAGNOSIS — K2281 Esophageal polyp: Secondary | ICD-10-CM | POA: Diagnosis not present

## 2020-06-19 DIAGNOSIS — K2270 Barrett's esophagus without dysplasia: Secondary | ICD-10-CM | POA: Insufficient documentation

## 2020-06-19 DIAGNOSIS — K219 Gastro-esophageal reflux disease without esophagitis: Secondary | ICD-10-CM | POA: Insufficient documentation

## 2020-06-19 DIAGNOSIS — Z800 Family history of malignant neoplasm of digestive organs: Secondary | ICD-10-CM | POA: Insufficient documentation

## 2020-06-19 DIAGNOSIS — R12 Heartburn: Secondary | ICD-10-CM | POA: Diagnosis not present

## 2020-06-19 DIAGNOSIS — K602 Anal fissure, unspecified: Secondary | ICD-10-CM | POA: Diagnosis not present

## 2020-06-19 DIAGNOSIS — K3189 Other diseases of stomach and duodenum: Secondary | ICD-10-CM | POA: Diagnosis not present

## 2020-06-19 HISTORY — PX: POLYPECTOMY: SHX5525

## 2020-06-19 HISTORY — PX: COLONOSCOPY WITH PROPOFOL: SHX5780

## 2020-06-19 HISTORY — DX: Family history of other specified conditions: Z84.89

## 2020-06-19 HISTORY — PX: BIOPSY: SHX5522

## 2020-06-19 HISTORY — PX: ESOPHAGOGASTRODUODENOSCOPY (EGD) WITH PROPOFOL: SHX5813

## 2020-06-19 SURGERY — COLONOSCOPY WITH PROPOFOL
Anesthesia: General

## 2020-06-19 MED ORDER — PROPOFOL 10 MG/ML IV BOLUS
INTRAVENOUS | Status: AC
  Filled 2020-06-19: qty 40

## 2020-06-19 MED ORDER — CHLORHEXIDINE GLUCONATE CLOTH 2 % EX PADS: 6.0000 | MEDICATED_PAD | Freq: Once | CUTANEOUS | Status: DC

## 2020-06-19 MED ORDER — OMEPRAZOLE 40 MG PO CPDR: 40.0000 mg | DELAYED_RELEASE_CAPSULE | Freq: Two times a day (BID) | ORAL | 5 refills | Status: AC

## 2020-06-19 MED ORDER — LACTATED RINGERS IV SOLN
INTRAVENOUS | Status: DC
  Administered 2020-06-19: 1000 mL via INTRAVENOUS

## 2020-06-19 MED ORDER — PROPOFOL 10 MG/ML IV BOLUS
INTRAVENOUS | Status: DC | PRN
  Administered 2020-06-19: 40 mg via INTRAVENOUS
  Administered 2020-06-19: 50 mg via INTRAVENOUS
  Administered 2020-06-19: 20 mg via INTRAVENOUS
  Administered 2020-06-19: 50 mg via INTRAVENOUS

## 2020-06-19 MED ORDER — GLYCOPYRROLATE 0.2 MG/ML IJ SOLN
INTRAMUSCULAR | Status: AC
  Administered 2020-06-19: 0.2 mg
  Filled 2020-06-19: qty 1

## 2020-06-19 MED ORDER — PROPOFOL 500 MG/50ML IV EMUL
INTRAVENOUS | Status: DC | PRN
  Administered 2020-06-19: 150 ug/kg/min via INTRAVENOUS

## 2020-06-19 MED ORDER — STERILE WATER FOR IRRIGATION IR SOLN
Status: DC | PRN
  Administered 2020-06-19: 100 mL

## 2020-06-19 MED ORDER — LIDOCAINE VISCOUS HCL 2 % MT SOLN
OROMUCOSAL | Status: AC
  Administered 2020-06-19: 15 mL
  Filled 2020-06-19: qty 15

## 2020-06-19 NOTE — Op Note (Signed)
[facility] Patient Name: Joel Berry Procedure Date: 06/19/2020 11:31 AM MRN: [id number] Date of Birth: 06/01/1981 Attending MD: Elon Alas. Abbey Chatters DO CSN: [id number] Age: 39 Admit Type: Outpatient Procedure:                Upper GI endoscopy Indications:              Heartburn, Suspected gastro-esophageal reflux                            disease Providers:                Elon Alas. Abbey Chatters, DO, Crystal Page, Wynonia Musty Tech, Technician Referring MD:              Medicines:                See the Anesthesia note for documentation of the                            administered medications Complications:            No immediate complications. Estimated Blood Loss:     Estimated blood loss was minimal. Procedure:                Pre-Anesthesia Assessment:                           - The anesthesia plan was to use monitored                            anesthesia care (MAC).                           After obtaining informed consent, the endoscope was                            passed under direct vision. Throughout the                            procedure, the patient's blood pressure, pulse, and                            oxygen saturations were monitored continuously. The                            [id number]-H190 ([id number]) scope was introduced through the                            mouth, and advanced to the second part of duodenum.                            The upper GI endoscopy was accomplished without                            difficulty. The patient tolerated the procedure  well. Scope In: 11:47:05 AM Scope Out: 11:51:45 AM Total Procedure Duration: 0 hours 4 minutes 40 seconds  Findings:      Mucosal changes including ringed esophagus, feline appearance and       longitudinal furrows were found in the middle third of the esophagus.       Biopsies were taken with a cold forceps for histology.      The Z-line was  variable. Biopsies were taken with a cold forceps for       histology.      Localized moderate inflammation characterized by erosions and erythema       was found in the gastric antrum. Biopsies were taken with a cold forceps       for Helicobacter pylori testing.      The duodenal bulb, first portion of the duodenum and second portion of       the duodenum were normal. Biopsies for histology were taken with a cold       forceps for evaluation of celiac disease. Impression:               - Esophageal mucosal changes suspicious for                            eosinophilic esophagitis. Biopsied.                           - Z-line variable. Biopsied.                           - Gastritis. Biopsied.                           - Normal duodenal bulb, first portion of the                            duodenum and second portion of the duodenum.                            Biopsied. Moderate Sedation:      Per Anesthesia Care Recommendation:           - Patient has a contact number available for                            emergencies. The signs and symptoms of potential                            delayed complications were discussed with the                            patient. Return to normal activities tomorrow.                            Written discharge instructions were provided to the                            patient.                           -  Resume previous diet.                           - Continue present medications.                           - Await pathology results.                           - Use Prilosec (omeprazole) 40 mg PO BID.                           - Return to GI clinic in 4 weeks with Dr. Abbey Chatters Procedure Code(s):        --- Professional ---                           [id number], Esophagogastroduodenoscopy, flexible,                            transoral; with biopsy, single or multiple Diagnosis Code(s):        --- Professional ---                           K22.8, Other  specified diseases of esophagus                           K29.70, Gastritis, unspecified, without bleeding                           R12, Heartburn CPT copyright 2019 American Medical Association. All rights reserved. The codes documented in this report are preliminary and upon coder review may  be revised to meet current compliance requirements. Elon Alas. Abbey Chatters, DO [place] Abbey Chatters, DO 06/19/2020 12:08:02 PM This report has been signed electronically. Number of Addenda: 0

## 2020-06-19 NOTE — Transfer of Care (Signed)
Immediate Anesthesia Transfer of Care Note  Patient: NATAN HARTOG  Procedure(s) Performed: COLONOSCOPY WITH PROPOFOL (N/A ) ESOPHAGOGASTRODUODENOSCOPY (EGD) WITH PROPOFOL (N/A ) BIOPSY POLYPECTOMY  Patient Location: Endoscopy Unit  Anesthesia Type:General  Level of Consciousness: awake  Airway & Oxygen Therapy: Patient Spontanous Breathing  Post-op Assessment: Report given to RN  Post vital signs: Reviewed  Last Vitals:  Vitals Value Taken Time  BP 98/59 06/19/20 1213  Temp 36.5 C 06/19/20 1213  Pulse    Resp 17 06/19/20 1213  SpO2 98 % 06/19/20 1213    Last Pain:  Vitals:   06/19/20 1213  TempSrc: Oral  PainSc: 0-No pain      Patients Stated Pain Goal: 9 (17/83/75 4237)  Complications: No complications documented.

## 2020-06-19 NOTE — Anesthesia Postprocedure Evaluation (Signed)
Anesthesia Post Note  Patient: Joel Berry  Procedure(s) Performed: COLONOSCOPY WITH PROPOFOL (N/A ) ESOPHAGOGASTRODUODENOSCOPY (EGD) WITH PROPOFOL (N/A ) BIOPSY POLYPECTOMY  Patient location during evaluation: Endoscopy Anesthesia Type: General Level of consciousness: awake and alert and oriented Pain management: pain level controlled Vital Signs Assessment: post-procedure vital signs reviewed and stable Respiratory status: spontaneous breathing Cardiovascular status: blood pressure returned to baseline and stable Postop Assessment: no apparent nausea or vomiting Anesthetic complications: no   No complications documented.   Last Vitals:  Vitals:   06/19/20 1001 06/19/20 1213  BP: (!) 141/88 (!) 98/59  Pulse: 65   Resp: 15 17  Temp: 37 C 36.5 C  SpO2: 100% 98%    Last Pain:  Vitals:   06/19/20 1213  TempSrc: Oral  PainSc: 0-No pain                 Renesha Lizama

## 2020-06-19 NOTE — Op Note (Signed)
[facility] Patient Name: Joel Berry Procedure Date: 06/19/2020 11:53 AM MRN: [id number] Date of Birth: 06-11-1981 Attending MD: Elon Alas. Abbey Chatters DO CSN: [id number] Age: 39 Admit Type: Outpatient Procedure:                Colonoscopy Indications:              Rectal bleeding Providers:                Elon Alas. Abbey Chatters, DO, Crystal Page, Wynonia Musty Tech, Technician Referring MD:              Medicines:                See the Anesthesia note for documentation of the                            administered medications Complications:            No immediate complications. Estimated Blood Loss:     Estimated blood loss was minimal. Procedure:                Pre-Anesthesia Assessment:                           - The anesthesia plan was to use monitored                            anesthesia care (MAC).                           After obtaining informed consent, the colonoscope                            was passed under direct vision. Throughout the                            procedure, the patient's blood pressure, pulse, and                            oxygen saturations were monitored continuously. The                            [id number]-[id number] ([id number]) scope was introduced through                            the anus and advanced to the the terminal ileum,                            with identification of the appendiceal orifice and                            IC valve. The colonoscopy was performed without                            difficulty. The patient tolerated the procedure  well. The quality of the bowel preparation was                            evaluated using the BBPS [facility] Bowel Preparation                            Scale) with scores of: Right Colon = 2 (minor                            amount of residual staining, small fragments of                            stool and/or opaque liquid, but mucosa seen well),                             Transverse Colon = 3 (entire mucosa seen well with                            no residual staining, small fragments of stool or                            opaque liquid) and Left Colon = 3 (entire mucosa                            seen well with no residual staining, small                            fragments of stool or opaque liquid). The total                            BBPS score equals 8. The quality of the bowel                            preparation was good. Scope In: 11:57:38 AM Scope Out: 12:06:45 PM Scope Withdrawal Time: 0 hours 6 minutes 38 seconds  Total Procedure Duration: 0 hours 9 minutes 7 seconds  Findings:      An anal fissure was found on perianal exam.      Non-bleeding internal hemorrhoids were found during retroflexion.      The terminal ileum appeared normal.      A 4 mm polyp was found in the cecum. The polyp was sessile. The polyp       was removed with a cold snare. Resection and retrieval were complete.      The exam was otherwise without abnormality. Impression:               - Anal fissure found on perianal exam.                           - Non-bleeding internal hemorrhoids.                           - The examined portion of the ileum was normal.                           -  One 4 mm polyp in the cecum, removed with a cold                            snare. Resected and retrieved.                           - The examination was otherwise normal. Moderate Sedation:      Per Anesthesia Care Recommendation:           - Patient has a contact number available for                            emergencies. The signs and symptoms of potential                            delayed complications were discussed with the                            patient. Return to normal activities tomorrow.                            Written discharge instructions were provided to the                            patient.                           - Resume  previous diet.                           - Continue present medications.                           - Await pathology results.                           - Repeat colonoscopy in 5 years for surveillance.                           - Return to GI clinic in 4 weeks.                           - Continue supportive care for your anal fissure.                            We may consider hemorrhoid banding in the future                            once anal fissure has healed if you continue to                            have bleeding. Procedure Code(s):        --- Professional ---                           [phone], Colonoscopy, flexible; with  removal of                            tumor(s), polyp(s), or other lesion(s) by snare                            technique Diagnosis Code(s):        --- Professional ---                           K60.2, Anal fissure, unspecified                           K64.8, Other hemorrhoids                           K63.5, Polyp of colon                           K62.5, Hemorrhage of anus and rectum CPT copyright 2019 American Medical Association. All rights reserved. The codes documented in this report are preliminary and upon coder review may  be revised to meet current compliance requirements. Elon Alas. Abbey Chatters, DO [place] Abbey Chatters, DO 06/19/2020 12:11:14 PM This report has been signed electronically. Number of Addenda: 0

## 2020-06-19 NOTE — Discharge Instructions (Addendum)
Upper Endoscopy, Adult, Care After This sheet gives you information about how to care for yourself after your procedure. Your health care provider may also give you more specific instructions. If you have problems or questions, contact your health care provider. What can I expect after the procedure? After the procedure, it is common to have: A sore throat. Mild stomach pain or discomfort. Bloating. Nausea. Follow these instructions at home:  Follow instructions from your health care provider about what to eat or drink after your procedure. Return to your normal activities as told by your health care provider. Ask your health care provider what activities are safe for you. Take over-the-counter and prescription medicines only as told by your health care provider. Do not drive for 24 hours if you were given a sedative during your procedure. Keep all follow-up visits as told by your health care provider. This is important. Contact a health care provider if you have: A sore throat that lasts longer than one day. Trouble swallowing. Get help right away if: You vomit blood or your vomit looks like coffee grounds. You have: A fever. Bloody, black, or tarry stools. A severe sore throat or you cannot swallow. Difficulty breathing. Severe pain in your chest or abdomen. Summary After the procedure, it is common to have a sore throat, mild stomach discomfort, bloating, and nausea. Do not drive for 24 hours if you were given a sedative during the procedure. Follow instructions from your health care provider about what to eat or drink after your procedure. Return to your normal activities as told by your health care provider. This information is not intended to replace advice given to you by your health care provider. Make sure you discuss any questions you have with your health care provider. Document Revised: 02/10/2018 Document Reviewed: 01/19/2018 Elsevier Patient Education  2020 [employer]. Hemorrhoids Hemorrhoids are swollen veins that may develop: In the butt (rectum). These are called internal hemorrhoids. Around the opening of the butt (anus). These are called external hemorrhoids. Hemorrhoids can cause pain, itching, or bleeding. Most of the time, they do not cause serious problems. They usually get better with diet changes, lifestyle changes, and other home treatments. What are the causes? This condition may be caused by: Having trouble pooping (constipation). Pushing hard (straining) to poop. Watery poop (diarrhea). Pregnancy. Being very overweight (obese). Sitting for long periods of time. Heavy lifting or other activity that causes you to strain. Anal sex. Riding a bike for a long period of time. What are the signs or symptoms? Symptoms of this condition include: Pain. Itching or soreness in the butt. Bleeding from the butt. Leaking poop. Swelling in the area. One or more lumps around the opening of your butt. How is this diagnosed? A doctor can often diagnose this condition by looking at the affected area. The doctor may also: Do an exam that involves feeling the area with a gloved hand (digital rectal exam). Examine the area inside your butt using a small tube (anoscope). Order blood tests. This may be done if you have lost a lot of blood. Have you get a test that involves looking inside the colon using a flexible tube with a camera on the end (sigmoidoscopy or colonoscopy). How is this treated? This condition can usually be treated at home. Your doctor may tell you to change what you eat, make lifestyle changes, or try home treatments. If these do not help, procedures can be done to remove the hemorrhoids or make them smaller.  These may involve: Placing rubber bands at the base of the hemorrhoids to cut off their blood supply. Injecting medicine into the hemorrhoids to shrink them. Shining a type of light energy onto the hemorrhoids to cause them to  fall off. Doing surgery to remove the hemorrhoids or cut off their blood supply. Follow these instructions at home: Eating and drinking  Eat foods that have a lot of fiber in them. These include whole grains, beans, nuts, fruits, and vegetables. Ask your doctor about taking products that have added fiber (fibersupplements). Reduce the amount of fat in your diet. You can do this by: Eating low-fat dairy products. Eating less red meat. Avoiding processed foods. Drink enough fluid to keep your pee (urine) pale yellow. Managing pain and swelling  Take a warm-water bath (sitz bath) for 20 minutes to ease pain. Do this 3-4 times a day. You may do this in a bathtub or using a portable sitz bath that fits over the toilet. If told, put ice on the painful area. It may be helpful to use ice between your warm baths. Put ice in a plastic bag. Place a towel between your skin and the bag. Leave the ice on for 20 minutes, 2-3 times a day. General instructions Take over-the-counter and prescription medicines only as told by your doctor. Medicated creams and medicines may be used as told. Exercise often. Ask your doctor how much and what kind of exercise is best for you. Go to the bathroom when you have the urge to poop. Do not wait. Avoid pushing too hard when you poop. Keep your butt dry and clean. Use wet toilet paper or moist towelettes after pooping. Do not sit on the toilet for a long time. Keep all follow-up visits as told by your doctor. This is important. Contact a doctor if you: Have pain and swelling that do not get better with treatment or medicine. Have trouble pooping. Cannot poop. Have pain or swelling outside the area of the hemorrhoids. Get help right away if you have: Bleeding that will not stop. Summary Hemorrhoids are swollen veins in the butt or around the opening of the butt. They can cause pain, itching, or bleeding. Eat foods that have a lot of fiber in them. These include  whole grains, beans, nuts, fruits, and vegetables. Take a warm-water bath (sitz bath) for 20 minutes to ease pain. Do this 3-4 times a day. This information is not intended to replace advice given to you by your health care provider. Make sure you discuss any questions you have with your health care provider. Document Revised: 08/27/2018 Document Reviewed: 01/08/2018 Elsevier Patient Education  [place]. Colon Polyps  Polyps are tissue growths inside the body. Polyps can grow in many places, including the large intestine (colon). A polyp may be a round bump or a mushroom-shaped growth. You could have one polyp or several. Most colon polyps are noncancerous (benign). However, some colon polyps can become cancerous over time. Finding and removing the polyps early can help prevent this. What are the causes? The exact cause of colon polyps is not known. What increases the risk? You are more likely to develop this condition if you: Have a family history of colon cancer or colon polyps. Are older than 71 or older than 45 if you are African American. Have inflammatory bowel disease, such as ulcerative colitis or Crohn's disease. Have certain hereditary conditions, such as: Familial adenomatous polyposis. Lynch syndrome. Turcot syndrome. Peutz-Jeghers syndrome. Are overweight. Smoke cigarettes.  Do not get enough exercise. Drink too much alcohol. Eat a diet that is high in fat and red meat and low in fiber. Had childhood cancer that was treated with abdominal radiation. What are the signs or symptoms? Most polyps do not cause symptoms. If you have symptoms, they may include: Blood coming from your rectum when having a bowel movement. Blood in your stool. The stool may look dark red or black. Abdominal pain. A change in bowel habits, such as constipation or diarrhea. How is this diagnosed? This condition is diagnosed with a colonoscopy. This is a procedure in which a lighted,  flexible scope is inserted into the anus and then passed into the colon to examine the area. Polyps are sometimes found when a colonoscopy is done as part of routine cancer screening tests. How is this treated? Treatment for this condition involves removing any polyps that are found. Most polyps can be removed during a colonoscopy. Those polyps will then be tested for cancer. Additional treatment may be needed depending on the results of testing. Follow these instructions at home: Lifestyle Maintain a healthy weight, or lose weight if recommended by your health care provider. Exercise every day or as told by your health care provider. Do not use any products that contain nicotine or tobacco, such as cigarettes and e-cigarettes. If you need help quitting, ask your health care provider. If you drink alcohol, limit how much you have: 0-1 drink a day for women. 0-2 drinks a day for men. Be aware of how much alcohol is in your drink. In the [place] one drink equals one 12 oz bottle of beer (355 mL), one 5 oz glass of wine (148 mL), or one 1 oz shot of hard liquor (44 mL). Eating and drinking  Eat foods that are high in fiber, such as fruits, vegetables, and whole grains. Eat foods that are high in calcium and vitamin D, such as milk, cheese, yogurt, eggs, liver, fish, and broccoli. Limit foods that are high in fat, such as fried foods and desserts. Limit the amount of red meat and processed meat you eat, such as hot dogs, sausage, bacon, and lunch meats. General instructions Keep all follow-up visits as told by your health care provider. This is important. This includes having regularly scheduled colonoscopies. Talk to your health care provider about when you need a colonoscopy. Contact a health care provider if: You have new or worsening bleeding during a bowel movement. You have new or increased blood in your stool. You have a change in bowel habits. You lose weight for no known  reason. Summary Polyps are tissue growths inside the body. Polyps can grow in many places, including the colon. Most colon polyps are noncancerous (benign), but some can become cancerous over time. This condition is diagnosed with a colonoscopy. Treatment for this condition involves removing any polyps that are found. Most polyps can be removed during a colonoscopy. This information is not intended to replace advice given to you by your health care provider. Make sure you discuss any questions you have with your health care provider. Document Revised: 12/04/2017 Document Reviewed: 12/04/2017 Elsevier Patient Education  [place]. EGD Discharge instructions Please read the instructions outlined below and refer to this sheet in the next few weeks. These discharge instructions provide you with general information on caring for yourself after you leave the hospital. Your doctor may also give you specific instructions. While your treatment has been planned according to the most current medical practices  available, unavoidable complications occasionally occur. If you have any problems or questions after discharge, please call your doctor. ACTIVITY  You may resume your regular activity but move at a slower pace for the next 24 hours.   Take frequent rest periods for the next 24 hours.   Walking will help expel (get rid of) the air and reduce the bloated feeling in your abdomen.   No driving for 24 hours (because of the anesthesia (medicine) used during the test).   You may shower.   Do not sign any important legal documents or operate any machinery for 24 hours (because of the anesthesia used during the test).  NUTRITION  Drink plenty of fluids.   You may resume your normal diet.   Begin with a light meal and progress to your normal diet.   Avoid alcoholic beverages for 24 hours or as instructed by your caregiver.  MEDICATIONS  You may resume your normal medications unless your  caregiver tells you otherwise.  WHAT YOU CAN EXPECT TODAY  You may experience abdominal discomfort such as a feeling of fullness or "gas" pains.  FOLLOW-UP  Your doctor will discuss the results of your test with you.  SEEK IMMEDIATE MEDICAL ATTENTION IF ANY OF THE FOLLOWING OCCUR:  Excessive nausea (feeling sick to your stomach) and/or vomiting.   Severe abdominal pain and distention (swelling).   Trouble swallowing.   Temperature over 101 F (37.8 C).   Rectal bleeding or vomiting of blood.     Colonoscopy Discharge Instructions  Read the instructions outlined below and refer to this sheet in the next few weeks. These discharge instructions provide you with general information on caring for yourself after you leave the hospital. Your doctor may also give you specific instructions. While your treatment has been planned according to the most current medical practices available, unavoidable complications occasionally occur.   ACTIVITY  You may resume your regular activity, but move at a slower pace for the next 24 hours.   Take frequent rest periods for the next 24 hours.   Walking will help get rid of the air and reduce the bloated feeling in your belly (abdomen).   No driving for 24 hours (because of the medicine (anesthesia) used during the test).    Do not sign any important legal documents or operate any machinery for 24 hours (because of the anesthesia used during the test).  NUTRITION  Drink plenty of fluids.   You may resume your normal diet as instructed by your doctor.   Begin with a light meal and progress to your normal diet. Heavy or fried foods are harder to digest and may make you feel sick to your stomach (nauseated).   Avoid alcoholic beverages for 24 hours or as instructed.  MEDICATIONS  You may resume your normal medications unless your doctor tells you otherwise.  WHAT YOU CAN EXPECT TODAY  Some feelings of bloating in the abdomen.   Passage of  more gas than usual.   Spotting of blood in your stool or on the toilet paper.  IF YOU HAD POLYPS REMOVED DURING THE COLONOSCOPY:  No aspirin products for 7 days or as instructed.   No alcohol for 7 days or as instructed.   Eat a soft diet for the next 24 hours.  FINDING OUT THE RESULTS OF YOUR TEST Not all test results are available during your visit. If your test results are not back during the visit, make an appointment with your caregiver to find  out the results. Do not assume everything is normal if you have not heard from your caregiver or the medical facility. It is important for you to follow up on all of your test results.  SEEK IMMEDIATE MEDICAL ATTENTION IF:  You have more than a spotting of blood in your stool.   Your belly is swollen (abdominal distention).   You are nauseated or vomiting.   You have a temperature over 101.   You have abdominal pain or discomfort that is severe or gets worse throughout the day.   Your EGD showed inflammation in your stomach.  I biopsied this to rule out an infection with H. pylori.  Your esophagus had findings suspicious for a condition called eosinophilic esophagitis.  I biopsied this as well and we should have these results back within a week or so.  I recommend starting omeprazole 40 mg twice daily for at least the next 8 weeks.  I have sent this prescription to your pharmacy.  You had 1 polyp on your colonoscopy which I removed successfully.  Appears you do have an anal fissure which looks to be healing.  I would continue conservative measures for this for now.  You also have internal hemorrhoids which we can discuss banding in the outpatient clinic for once your anal fissure is healed.    Repeat colonoscopy in 5 years due to family history and 1 polyp removed.  Follow-up with Dr. Abbey Chatters in 4 weeks or sooner if needed.  I hope you have a great rest of your week!  Elon Alas. Abbey Chatters, D.O. Gastroenterology and Hepatology [facility]  [facility]

## 2020-06-19 NOTE — Anesthesia Preprocedure Evaluation (Signed)
Anesthesia Evaluation  Patient identified by MRN, date of birth, ID band Patient awake    Reviewed: Allergy & Precautions, H&P , NPO status , Patient's Chart, lab work & pertinent test results, reviewed documented beta blocker date and time   Airway Mallampati: II  TM Distance: >3 FB Neck ROM: full    Dental no notable dental hx. (+) Teeth Intact   Pulmonary neg pulmonary ROS,    Pulmonary exam normal breath sounds clear to auscultation       Cardiovascular Exercise Tolerance: Good negative cardio ROS   Rhythm:regular Rate:Normal     Neuro/Psych negative neurological ROS  negative psych ROS   GI/Hepatic Neg liver ROS, GERD  Medicated,  Endo/Other  negative endocrine ROS  Renal/GU negative Renal ROS  negative genitourinary   Musculoskeletal   Abdominal   Peds  Hematology negative hematology ROS (+)   Anesthesia Other Findings   Reproductive/Obstetrics negative OB ROS                             Anesthesia Physical Anesthesia Plan  ASA: II  Anesthesia Plan: General   Post-op Pain Management:    Induction:   PONV Risk Score and Plan: Propofol infusion  Airway Management Planned:   Additional Equipment:   Intra-op Plan:   Post-operative Plan:   Informed Consent: I have reviewed the patients History and Physical, chart, labs and discussed the procedure including the risks, benefits and alternatives for the proposed anesthesia with the patient or authorized representative who has indicated his/her understanding and acceptance.     Dental Advisory Given  Plan Discussed with: CRNA  Anesthesia Plan Comments:         Anesthesia Quick Evaluation  

## 2020-06-19 NOTE — Interval H&P Note (Signed)
History and Physical Interval Note:  06/19/2020 11:23 AM  Joel Berry  has presented today for surgery, with the diagnosis of rectal bleeding, gerd.  The various methods of treatment have been discussed with the patient and family. After consideration of risks, benefits and other options for treatment, the patient has consented to  Procedure(s) with comments: COLONOSCOPY WITH PROPOFOL (N/A) - 1:00pm ESOPHAGOGASTRODUODENOSCOPY (EGD) WITH PROPOFOL (N/A) as a surgical intervention.  The patient's history has been reviewed, patient examined, no change in status, stable for surgery.  I have reviewed the patient's chart and labs.  Questions were answered to the patient's satisfaction.     Eloise Harman

## 2020-06-20 ENCOUNTER — Other Ambulatory Visit: Payer: Self-pay

## 2020-06-20 LAB — SURGICAL PATHOLOGY

## 2020-06-21 ENCOUNTER — Encounter (HOSPITAL_COMMUNITY): Payer: Self-pay | Admitting: Internal Medicine

## 2020-06-27 ENCOUNTER — Ambulatory Visit (INDEPENDENT_AMBULATORY_CARE_PROVIDER_SITE_OTHER): Payer: BC Managed Care – PPO | Admitting: General Surgery

## 2020-06-27 ENCOUNTER — Other Ambulatory Visit: Payer: Self-pay

## 2020-06-27 ENCOUNTER — Encounter: Payer: Self-pay | Admitting: General Surgery

## 2020-06-27 VITALS — BP 121/76 | HR 66 | Temp 98.1°F | Resp 16 | Ht 68.0 in | Wt 204.0 lb

## 2020-06-27 DIAGNOSIS — K602 Anal fissure, unspecified: Secondary | ICD-10-CM

## 2020-06-27 NOTE — Patient Instructions (Signed)
Will refer to Leighton Ruff, MD- Colorectal for possible botox treatment for fissure. Call with issues or problems. Continue to use your cream as needed.

## 2020-06-27 NOTE — Progress Notes (Signed)
[facility] Note   HPI:  39 y.o. 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 He had his colonoscopy with Dr. Abbey Chatters given his mother's history of colon cancer and 1 sessile serrated polyp removed, ileum normal.   He says he still has intermittent bleeding and has discomfort at times. It is manageable but he would like to be normal again. He is using the dilt/ lidocaine cream and having regular soft BMs.   Review of Systems:  Continued bleeding Some discomfort All other review of systems: otherwise negative   Vital Signs:  BP 121/76   Pulse 66   Temp 98.1 F (36.7 C) (Oral)   Resp 16   Ht 5\' 8"  (1.727 m)   Wt 204 lb (92.5 kg)   SpO2 97%   BMI 31.02 kg/m    Physical Exam:  Physical Exam Cardiovascular:     Rate and Rhythm: Normal rate.  Pulmonary:     Effort: Pulmonary effort is normal.  Genitourinary:    Comments: Continued unchanged chronic posterior fistula Neurological:     Mental Status: He is alert.      Assessment:  39 y.o. yo Male with chronic appearing posterior fissure. He has had a colonoscopy that showed no significant disease. We discussed the option of Botox and that I have verified that Dr. Marcello Moores does this in [place]. He is interested in this option.   Plan:  - Will refer to Dr. Marcello Moores for further evaluation and possible botox  - Patient will continue dilt/ lidocaine as needed, and keep stools soft - Call with questions or concerns     Curlene Labrum, MD [facility] [address] [place], [place] [id number] [phone] (office)

## 2020-06-28 ENCOUNTER — Telehealth: Payer: Self-pay | Admitting: Family Medicine

## 2020-06-28 NOTE — Telephone Encounter (Signed)
[facility] to get apt with Dr. Leighton Ruff for botox for anal fissure. Appointment made for first available on 07/31/2020 at 9:10 with arrival time of 8:40am. They will send out a packet of information for the pt to fill out before apt. Pt aware of all of the above.

## 2020-07-12 ENCOUNTER — Ambulatory Visit: Payer: BC Managed Care – PPO | Admitting: Internal Medicine

## 2020-07-24 ENCOUNTER — Ambulatory Visit: Payer: Self-pay | Admitting: General Surgery

## 2020-07-24 DIAGNOSIS — K602 Anal fissure, unspecified: Secondary | ICD-10-CM | POA: Diagnosis not present

## 2020-07-24 NOTE — H&P (Signed)
The patient is a 39 year old male who presents with anal pain. 39 year old male who presents to the office with approximately a 15 year history of chronic anal pain and fissures. He has been treated with diltiazem ointment on many occasions and this relieves his symptoms temporarily. He is here today to discuss any surgical options. He has had a colonoscopy recently which was normal except for one sessile serrated polyp. Terminal ileum was normal. Patient reports soft bowel movements and denies any chronic constipation.   Past Surgical History Antonietta Jewel, [place]; 07/24/2020 4:22 PM) Colon Polyp Removal - Colonoscopy Tonsillectomy  Diagnostic Studies History (Chanel Teressa Senter, CMA; 07/24/2020 4:22 PM) Colonoscopy within last year  Allergies [facility] Teressa Senter, CMA; 07/24/2020 4:23 PM) No Known Drug Allergies [07/24/2020]: Allergies Reconciled  Medication History (Chanel Teressa Senter, CMA; 07/24/2020 4:23 PM) Omeprazole (40MG  Capsule DR, Oral) Active. Montelukast Sodium (10MG  Tablet, Oral) Active. Xyzal (Oral) Specific strength unknown - Active. Medications Reconciled  Social History Antonietta Jewel, CMA; 07/24/2020 4:22 PM) Alcohol use Occasional alcohol use. Caffeine use Coffee, Tea. No drug use Tobacco use Never smoker.  Family History Antonietta Jewel, [place]; 07/24/2020 4:22 PM) Arthritis Father. Colon Cancer Family Members In General, Mother. Colon Polyps Family Members In General, Mother. Hypertension Father. Melanoma Family Members In General.  Other Problems Antonietta Jewel, [place]; 07/24/2020 4:22 PM) Gastroesophageal Reflux Disease Heart murmur Hemorrhoids Hypercholesterolemia     Review of Systems (Chanel Nolan CMA; 07/24/2020 4:22 PM) General Present- Fatigue and Weight Gain. Not Present- Appetite Loss, Chills, Fever, Night Sweats and Weight Loss. Skin Not Present- Change in Wart/Mole, Dryness, Hives, Jaundice, New Lesions, Non-Healing Wounds, Rash and  Ulcer. HEENT Present- Ringing in the Ears and Wears glasses/contact lenses. Not Present- Earache, Hearing Loss, Hoarseness, Nose Bleed, Oral Ulcers, Seasonal Allergies, Sinus Pain, Sore Throat, Visual Disturbances and Yellow Eyes. Respiratory Present- Difficulty Breathing. Not Present- Bloody sputum, Chronic Cough, Snoring and Wheezing. Breast Not Present- Breast Mass, Breast Pain, Nipple Discharge and Skin Changes. Cardiovascular Present- Difficulty Breathing Lying Down and Shortness of Breath. Not Present- Chest Pain, Leg Cramps, Palpitations, Rapid Heart Rate and Swelling of Extremities. Gastrointestinal Present- Hemorrhoids and Rectal Pain. Not Present- Abdominal Pain, Bloating, Bloody Stool, Change in Bowel Habits, Chronic diarrhea, Constipation, Difficulty Swallowing, Excessive gas, Gets full quickly at meals, Indigestion, Nausea and Vomiting. Male Genitourinary Not Present- Blood in Urine, Change in Urinary Stream, Frequency, Impotence, Nocturia, Painful Urination, Urgency and Urine Leakage. Musculoskeletal Present- Joint Pain and Joint Stiffness. Not Present- Back Pain, Muscle Pain, Muscle Weakness and Swelling of Extremities. Neurological Present- Decreased Memory and Tingling. Not Present- Fainting, Headaches, Numbness, Seizures, Tremor, Trouble walking and Weakness. Psychiatric Not Present- Anxiety, Bipolar, Change in Sleep Pattern, Depression, Fearful and Frequent crying. Endocrine Not Present- Cold Intolerance, Excessive Hunger, Hair Changes, Heat Intolerance, Hot flashes and New Diabetes.  Vitals (Chanel Nolan CMA; 07/24/2020 4:23 PM) 07/24/2020 4:23 PM Weight: 208.13 lb Height: 68in Body Surface Area: 2.08 m Body Mass Index: 31.64 kg/m  Temp.: 97.53F  Pulse: 91 (Regular)         Physical Exam Leighton Ruff MD; 65/46/5035 4:33 PM)  General Mental Status-Alert. General Appearance-Cooperative.  Rectal Anorectal Exam External - anal fissure(Posterior  midline, chronic appearing with rolled borders.).    Assessment & Plan Leighton Ruff MD; 46/56/8127 4:32 PM)  ANAL FISSURE (K60.2) Impression: 39 year old male with a chronic anal fissure. This is failed multiple attempts at medical management. I have recommended fissurectomy and chemical sphincterotomy as a surgical attempt to heal his fissure. We discussed  the possible risk of temporary incontinence. All questions were answered. We will proceed with surgery as soon as possible.

## 2020-07-24 NOTE — H&P (View-Only) (Signed)
The patient is a 39 year old male who presents with anal pain. 39 year old male who presents to the office with approximately a 15 year history of chronic anal pain and fissures. He has been treated with diltiazem ointment on many occasions and this relieves his symptoms temporarily. He is here today to discuss any surgical options. He has had a colonoscopy recently which was normal except for one sessile serrated polyp. Terminal ileum was normal. Patient reports soft bowel movements and denies any chronic constipation.   Past Surgical History Antonietta Jewel, [place]; 07/24/2020 4:22 PM) Colon Polyp Removal - Colonoscopy Tonsillectomy  Diagnostic Studies History (Chanel Teressa Senter, CMA; 07/24/2020 4:22 PM) Colonoscopy within last year  Allergies [facility] Teressa Senter, CMA; 07/24/2020 4:23 PM) No Known Drug Allergies [07/24/2020]: Allergies Reconciled  Medication History (Chanel Teressa Senter, CMA; 07/24/2020 4:23 PM) Omeprazole (40MG  Capsule DR, Oral) Active. Montelukast Sodium (10MG  Tablet, Oral) Active. Xyzal (Oral) Specific strength unknown - Active. Medications Reconciled  Social History Antonietta Jewel, CMA; 07/24/2020 4:22 PM) Alcohol use Occasional alcohol use. Caffeine use Coffee, Tea. No drug use Tobacco use Never smoker.  Family History Antonietta Jewel, [place]; 07/24/2020 4:22 PM) Arthritis Father. Colon Cancer Family Members In General, Mother. Colon Polyps Family Members In General, Mother. Hypertension Father. Melanoma Family Members In General.  Other Problems Antonietta Jewel, [place]; 07/24/2020 4:22 PM) Gastroesophageal Reflux Disease Heart murmur Hemorrhoids Hypercholesterolemia     Review of Systems (Chanel Nolan CMA; 07/24/2020 4:22 PM) General Present- Fatigue and Weight Gain. Not Present- Appetite Loss, Chills, Fever, Night Sweats and Weight Loss. Skin Not Present- Change in Wart/Mole, Dryness, Hives, Jaundice, New Lesions, Non-Healing Wounds, Rash and  Ulcer. HEENT Present- Ringing in the Ears and Wears glasses/contact lenses. Not Present- Earache, Hearing Loss, Hoarseness, Nose Bleed, Oral Ulcers, Seasonal Allergies, Sinus Pain, Sore Throat, Visual Disturbances and Yellow Eyes. Respiratory Present- Difficulty Breathing. Not Present- Bloody sputum, Chronic Cough, Snoring and Wheezing. Breast Not Present- Breast Mass, Breast Pain, Nipple Discharge and Skin Changes. Cardiovascular Present- Difficulty Breathing Lying Down and Shortness of Breath. Not Present- Chest Pain, Leg Cramps, Palpitations, Rapid Heart Rate and Swelling of Extremities. Gastrointestinal Present- Hemorrhoids and Rectal Pain. Not Present- Abdominal Pain, Bloating, Bloody Stool, Change in Bowel Habits, Chronic diarrhea, Constipation, Difficulty Swallowing, Excessive gas, Gets full quickly at meals, Indigestion, Nausea and Vomiting. Male Genitourinary Not Present- Blood in Urine, Change in Urinary Stream, Frequency, Impotence, Nocturia, Painful Urination, Urgency and Urine Leakage. Musculoskeletal Present- Joint Pain and Joint Stiffness. Not Present- Back Pain, Muscle Pain, Muscle Weakness and Swelling of Extremities. Neurological Present- Decreased Memory and Tingling. Not Present- Fainting, Headaches, Numbness, Seizures, Tremor, Trouble walking and Weakness. Psychiatric Not Present- Anxiety, Bipolar, Change in Sleep Pattern, Depression, Fearful and Frequent crying. Endocrine Not Present- Cold Intolerance, Excessive Hunger, Hair Changes, Heat Intolerance, Hot flashes and New Diabetes.  Vitals (Chanel Nolan CMA; 07/24/2020 4:23 PM) 07/24/2020 4:23 PM Weight: 208.13 lb Height: 68in Body Surface Area: 2.08 m Body Mass Index: 31.64 kg/m  Temp.: 97.35F  Pulse: 91 (Regular)         Physical Exam Leighton Ruff MD; 56/43/3295 4:33 PM)  General Mental Status-Alert. General Appearance-Cooperative.  Rectal Anorectal Exam External - anal fissure(Posterior  midline, chronic appearing with rolled borders.).    Assessment & Plan Leighton Ruff MD; 18/84/1660 4:32 PM)  ANAL FISSURE (K60.2) Impression: 39 year old male with a chronic anal fissure. This is failed multiple attempts at medical management. I have recommended fissurectomy and chemical sphincterotomy as a surgical attempt to heal his fissure. We discussed  the possible risk of temporary incontinence. All questions were answered. We will proceed with surgery as soon as possible.

## 2020-08-08 ENCOUNTER — Other Ambulatory Visit: Payer: Self-pay

## 2020-08-08 ENCOUNTER — Encounter (HOSPITAL_BASED_OUTPATIENT_CLINIC_OR_DEPARTMENT_OTHER): Payer: Self-pay | Admitting: General Surgery

## 2020-08-08 NOTE — Progress Notes (Signed)
Spoke w/ via phone for pre-op interview--- PT Lab needs dos---- no              Lab results------ current ekg in epic/ chart dated 03-26-2020 COVID test ------ 08-11-2020 @ 0825 Arrive at ------- 0900 NPO after MN NO Solid Food.  Clear liquids from MN until--- 0800 Medications to take morning of surgery ----- Xyzal, Prilosec Diabetic medication ----- n/a Patient Special Instructions ----- n/a Pre-Op special Istructions ----- n/a Patient verbalized understanding of instructions that were given at this phone interview. Patient denies shortness of breath, chest pain, fever, cough at this phone interview.

## 2020-08-09 ENCOUNTER — Telehealth: Payer: Self-pay

## 2020-08-09 ENCOUNTER — Encounter: Payer: Self-pay | Admitting: Internal Medicine

## 2020-08-09 ENCOUNTER — Ambulatory Visit (INDEPENDENT_AMBULATORY_CARE_PROVIDER_SITE_OTHER): Payer: BC Managed Care – PPO | Admitting: Internal Medicine

## 2020-08-09 VITALS — BP 129/79 | HR 75 | Temp 97.5°F | Ht 68.0 in | Wt 209.6 lb

## 2020-08-09 DIAGNOSIS — K602 Anal fissure, unspecified: Secondary | ICD-10-CM

## 2020-08-09 DIAGNOSIS — K219 Gastro-esophageal reflux disease without esophagitis: Secondary | ICD-10-CM

## 2020-08-09 DIAGNOSIS — K200 Eosinophilic esophagitis: Secondary | ICD-10-CM | POA: Insufficient documentation

## 2020-08-09 DIAGNOSIS — K625 Hemorrhage of anus and rectum: Secondary | ICD-10-CM

## 2020-08-09 NOTE — Patient Instructions (Signed)
Decrease your omeprazole to 40 mg once daily.  Take 30 minutes before breakfast.  If your symptoms return then increase back up to twice daily dosing.  I am going to print you off information regarding a 6 food elimination diet.  You not to follow this to a T though it will likely improve your symptoms by avoiding items on it.  The most important ones to avoid would be milk and eggs as these have been shown to be the most implicated food items with regards to eosinophilic esophagitis.  We will tentatively plan on repeat EGD with repeat biopsies in the future..  Follow-up with GI in 3 months.  At [facility] we value your feedback. You may receive a survey about your visit today. Please share your experience as we strive to create trusting relationships with our patients to provide genuine, compassionate, quality care.  We appreciate your understanding and patience as we review any laboratory studies, imaging, and other diagnostic tests that are ordered as we care for you. Our office policy is 5 business days for review of these results, and any emergent or urgent results are addressed in a timely manner for your best interest. If you do not hear from our office in 1 week, please contact us.   We also encourage the use of MyChart, which contains your medical information for your review as well. If you are not enrolled in this feature, an access code is on this after visit summary for your convenience. Thank you for allowing [place] to be involved in your care.  It was great to see you today!  I hope you have a great rest of your winter!!    Elon Alas. Abbey Chatters, D.O. Gastroenterology and Hepatology [facility]

## 2020-08-09 NOTE — Progress Notes (Addendum)
Referring Provider: Leslie Andrea, MD Primary Care Physician:  Leslie Andrea, MD Primary GI:  Dr. Abbey Chatters  Chief Complaint  Patient presents with  . Follow-up    from EGD and CLN    HPI:   Joel Berry is a 39 y.o. male who presents to the clinic today for follow-up visit.  Recently underwent EGD for chronic reflux and dyspepsia.  Found to have findings consistent with EOE.  Biopsies were positive with greater than 25 eosinophils per high-power field.  I placed him on omeprazole 40 mg twice daily at that time and states his symptoms are vastly improved.  Colonoscopy revealed 1 sessile serrated polyp with 5-year recall.  He continues to have issues with his anal fissure.  Has failed multiple attempts at conservative measures and is scheduled to undergo fissurectomy and chemical sphincterotomy by Dr. Marcello Moores in the near future.  Continues have rectal discomfort and bleeding.  Past Medical History:  Diagnosis Date  . Allergic rhinitis   . Chronic anal fissure   . Dyspnea    08-08-2020  per pt has had sob since having  pneumonia 07/ 2020  . Family history of adverse reaction to anesthesia    Daughter @ age 1--- ran fever with tonsils out, nausa/ vomiting, pt stated happened several hours after procedure  . GERD (gastroesophageal reflux disease)   . History of cardiac murmur as a child   . History of chest pain 03/26/2020   ED visit in epic dx atypical chest pain/ sob,  was given prednisone and albuterol inhaler as needed, ruled noncardiac  (08-08-2020  pt denies any chest pain since)  . History of pneumonia 03/2019   per pt told was probable covid related even though tested negative    Past Surgical History:  Procedure Laterality Date  . BIOPSY  06/19/2020   Procedure: BIOPSY;  Surgeon: Eloise Harman, DO;  [facility];  Service: Endoscopy;;  . COLONOSCOPY WITH PROPOFOL N/A 06/19/2020   Procedure: COLONOSCOPY WITH PROPOFOL;  Surgeon: Eloise Harman, DO;   Location: [facility];  Service: Endoscopy;  Laterality: N/A;  1:00pm  . ESOPHAGOGASTRODUODENOSCOPY (EGD) WITH PROPOFOL N/A 06/19/2020   Procedure: ESOPHAGOGASTRODUODENOSCOPY (EGD) WITH PROPOFOL;  Surgeon: Eloise Harman, DO;  [facility]: [facility];  Service: Endoscopy;  Laterality: N/A;  . POLYPECTOMY  06/19/2020   Procedure: POLYPECTOMY;  Surgeon: Eloise Harman, DO;  Location: [facility];  Service: Endoscopy;;  . TONSILLECTOMY AND ADENOIDECTOMY  12/2019    Current Outpatient Medications  Medication Sig Dispense Refill  . levocetirizine (XYZAL) 5 MG tablet Take 1 tablet (5 mg total) by mouth in the morning and at bedtime. 60 tablet 5  . montelukast (SINGULAIR) 10 MG tablet Take 1 tablet (10 mg total) by mouth at bedtime. 30 tablet 5  . NONFORMULARY OR COMPOUNDED ITEM Place 1 application rectally as needed (anal fissure pain.). Diltiazem/Lidocaine Compounded Ointment (anal fissure)    . omeprazole (PRILOSEC) 40 MG capsule Take 1 capsule (40 mg total) by mouth in the morning and at bedtime. 60 capsule 5  . ALBUTEROL IN Inhale into the lungs as needed. (Patient not taking: Reported on 08/09/2020)    . Bioflavonoid Products (ESTER C PO) Take 1 tablet by mouth daily. (Patient not taking: Reported on 08/08/2020)     No current facility-administered medications for this visit.    Allergies as of 08/09/2020  . (No Known Allergies)    Family History  Problem Relation Age of Onset  .  Cancer Other   . Colon cancer Mother   . Colon cancer Maternal Grandmother   . Hypertension Father     Social History   Socioeconomic History  . Marital status: Married    Spouse name: Not on file  . Number of children: Not on file  . Years of education: Not on file  . Highest education level: Not on file  Occupational History  . Not on file  Tobacco Use  . Smoking status: Never Smoker  . Smokeless tobacco: Never Used  Vaping Use  . Vaping Use: Never used  Substance and Sexual Activity   . Alcohol use: Yes    Comment: occasional  . Drug use: Never  . Sexual activity: Not on file  Other Topics Concern  . Not on file  Social History Narrative  . Not on file   Social Determinants of Health   Financial Resource Strain:   . Difficulty of Paying Living Expenses: Not on file  Food Insecurity:   . Worried About Charity fundraiser in the Last Year: Not on file  . Ran Out of Food in the Last Year: Not on file  Transportation Needs:   . Lack of Transportation (Medical): Not on file  . Lack of Transportation (Non-Medical): Not on file  Physical Activity:   . Days of Exercise per Week: Not on file  . Minutes of Exercise per Session: Not on file  Stress:   . Feeling of Stress : Not on file  Social Connections:   . Frequency of Communication with Friends and Family: Not on file  . Frequency of Social Gatherings with Friends and Family: Not on file  . Attends Religious Services: Not on file  . Active Member of Clubs or Organizations: Not on file  . Attends Archivist Meetings: Not on file  . Marital Status: Not on file    Subjective: Review of Systems  Constitutional: Negative for chills and fever.  HENT: Negative for congestion and hearing loss.   Eyes: Negative for blurred vision and double vision.  Respiratory: Negative for cough and shortness of breath.   Cardiovascular: Negative for chest pain and palpitations.  Gastrointestinal: Positive for blood in stool. Negative for abdominal pain, constipation, diarrhea, heartburn, melena and vomiting.  Genitourinary: Negative for dysuria and urgency.  Musculoskeletal: Negative for joint pain and myalgias.  Skin: Negative for itching and rash.  Neurological: Negative for dizziness and headaches.  Psychiatric/Behavioral: Negative for depression. The patient is not nervous/anxious.      Objective: BP 129/79   Pulse 75   Temp (!) 97.5 F (36.4 C)   Ht 5\' 8"  (1.727 m)   Wt 209 lb 9.6 oz (95.1 kg)   BMI  31.87 kg/m  Physical Exam Constitutional:      Appearance: Normal appearance.  HENT:     Head: Normocephalic and atraumatic.  Eyes:     Extraocular Movements: Extraocular movements intact.     Conjunctiva/sclera: Conjunctivae normal.  Cardiovascular:     Rate and Rhythm: Normal rate and regular rhythm.  Pulmonary:     Effort: Pulmonary effort is normal.     Breath sounds: Normal breath sounds.  Abdominal:     General: Bowel sounds are normal.     Palpations: Abdomen is soft.  Musculoskeletal:        General: Normal range of motion.     Cervical back: Normal range of motion and neck supple.  Skin:    General: Skin is warm.  Neurological:     General: No focal deficit present.     Mental Status: He is alert and oriented to person, place, and time.  Psychiatric:        Mood and Affect: Mood normal.        Behavior: Behavior normal.      Assessment: *Eosinophilic esophagitis *Chronic reflux *Anal fissure *Internal hemorrhoids  Plan: From an upper GI standpoint, patient states his symptoms are vastly improved on twice daily omeprazole therapy.  Discussed eosinophilic esophagitis in depth with patient today.  I will decrease his omeprazole to 40 mg once daily.  Patient counseled that if his symptoms return he can return to twice daily dosing.  I have also printed them off information in regards to a 6 food elimination diet.  We will tentatively plan on repeat EGD with repeat biopsies in 2 to 3 months.  Patient's anal fissure continues to be an issue.  He has failed conservative measures.  He is seen surgery and is scheduled to undergo fissurectomy and chemical sphincterotomy by Dr. Marcello Moores in the near future.  Colonoscopy recall 2026 due to sessile serrated polyp  Patient follow-up with me in 3 months or sooner if needed.  08/09/2020 11:58 AM   Disclaimer: This note was dictated with voice recognition software. Similar sounding words can inadvertently be transcribed and may  not be corrected upon review.

## 2020-08-09 NOTE — Telephone Encounter (Signed)
Mailed the pt a package for diet for Eosinophilic Esophagitis. He had left the office before Dr Abbey Chatters brought it to [place].

## 2020-08-11 ENCOUNTER — Other Ambulatory Visit (HOSPITAL_COMMUNITY)
Admission: RE | Admit: 2020-08-11 | Discharge: 2020-08-11 | Disposition: A | Discharge status: Home / Self Care | Payer: BC Managed Care – PPO | Source: Ambulatory Visit | Attending: General Surgery | Admitting: General Surgery

## 2020-08-11 DIAGNOSIS — Z20822 Contact with and (suspected) exposure to covid-19: Secondary | ICD-10-CM | POA: Insufficient documentation

## 2020-08-11 DIAGNOSIS — Z01812 Encounter for preprocedural laboratory examination: Secondary | ICD-10-CM | POA: Insufficient documentation

## 2020-08-11 LAB — SARS CORONAVIRUS 2 (TAT 6-24 HRS): SARS Coronavirus 2: NEGATIVE

## 2020-08-14 NOTE — Anesthesia Preprocedure Evaluation (Addendum)
Anesthesia Evaluation  Patient identified by MRN, date of birth, ID band Patient awake    Reviewed: Allergy & Precautions, H&P , NPO status , Patient's Chart, lab work & pertinent test results, reviewed documented beta blocker date and time   History of Anesthesia Complications Negative for: history of anesthetic complications  Airway Mallampati: II  TM Distance: >3 FB Neck ROM: full    Dental no notable dental hx. (+) Teeth Intact   Pulmonary neg pulmonary ROS,    Pulmonary exam normal breath sounds clear to auscultation       Cardiovascular negative cardio ROS   Rhythm:regular Rate:Normal     Neuro/Psych negative neurological ROS  negative psych ROS   GI/Hepatic Neg liver ROS, GERD  Medicated,  Endo/Other  negative endocrine ROS  Renal/GU negative Renal ROS  negative genitourinary   Musculoskeletal   Abdominal   Peds  Hematology negative hematology ROS (+)   Anesthesia Other Findings   Reproductive/Obstetrics negative OB ROS                            Anesthesia Physical  Anesthesia Plan  ASA: II  Anesthesia Plan: MAC   Post-op Pain Management:    Induction:   PONV Risk Score and Plan: 2 and Propofol infusion and Ondansetron  Airway Management Planned: Natural Airway and Simple Face Mask  Additional Equipment:   Intra-op Plan:   Post-operative Plan:   Informed Consent: I have reviewed the patients History and Physical, chart, labs and discussed the procedure including the risks, benefits and alternatives for the proposed anesthesia with the patient or authorized representative who has indicated his/her understanding and acceptance.     Dental advisory given  Plan Discussed with: Anesthesiologist  Anesthesia Plan Comments:        Anesthesia Quick Evaluation

## 2020-08-14 NOTE — Patient Instructions (Addendum)
Seasonal and perennial allergic rhinitis(grass, ragweed, weeds, tree, dust mite, cockroach) Continue Xyzal 5 mg 1-2 times a day as needed for runny nose or itching Stop Singulair 10 mg due to vivid/"rough" dreams May use saline nasal rinse as needed for nasal symptoms.  Consider allergy injections as a means of long-term control. - Allergy injections "re-train" and "reset" the immune system to ignore environmental allergens and decrease the resulting immune response to those allergens (sneezing, itchy watery eyes, runny nose, nasal congestion, etc).    - Allergy injections improve symptoms in 75-85% of patients.   - We can discuss this more at the next appointment if the medications are not working for you.  Allergic contact dermatitis due to plants Stable now  Shortness of breath Continue albuterol 2 puffs every 4 hours as needed for cough,wheeze, tightness in chest, or shortness of breath Start Alvesco 80 mcg using 2 puffs twice a day with spacer. Samples given  Please let us know if this treatment plan is not working well for you Schedule a follow-up appointment in 4 weeks

## 2020-08-15 ENCOUNTER — Encounter (HOSPITAL_BASED_OUTPATIENT_CLINIC_OR_DEPARTMENT_OTHER): Payer: Self-pay | Admitting: General Surgery

## 2020-08-15 ENCOUNTER — Ambulatory Visit (HOSPITAL_BASED_OUTPATIENT_CLINIC_OR_DEPARTMENT_OTHER)
Admission: RE | Admit: 2020-08-15 | Discharge: 2020-08-15 | Disposition: A | Discharge status: Home / Self Care | Payer: BC Managed Care – PPO | Attending: General Surgery | Admitting: General Surgery

## 2020-08-15 ENCOUNTER — Ambulatory Visit (HOSPITAL_BASED_OUTPATIENT_CLINIC_OR_DEPARTMENT_OTHER): Payer: BC Managed Care – PPO | Admitting: Anesthesiology

## 2020-08-15 ENCOUNTER — Other Ambulatory Visit: Payer: Self-pay

## 2020-08-15 ENCOUNTER — Encounter (HOSPITAL_BASED_OUTPATIENT_CLINIC_OR_DEPARTMENT_OTHER)
Admission: RE | Disposition: A | Discharge status: Home / Self Care | Payer: Self-pay | Source: Home / Self Care | Attending: General Surgery

## 2020-08-15 DIAGNOSIS — K219 Gastro-esophageal reflux disease without esophagitis: Secondary | ICD-10-CM | POA: Diagnosis not present

## 2020-08-15 DIAGNOSIS — Z8371 Family history of colonic polyps: Secondary | ICD-10-CM | POA: Diagnosis not present

## 2020-08-15 DIAGNOSIS — L570 Actinic keratosis: Secondary | ICD-10-CM | POA: Insufficient documentation

## 2020-08-15 DIAGNOSIS — K601 Chronic anal fissure: Secondary | ICD-10-CM | POA: Diagnosis not present

## 2020-08-15 DIAGNOSIS — Z800 Family history of malignant neoplasm of digestive organs: Secondary | ICD-10-CM | POA: Diagnosis not present

## 2020-08-15 DIAGNOSIS — K641 Second degree hemorrhoids: Secondary | ICD-10-CM | POA: Diagnosis not present

## 2020-08-15 DIAGNOSIS — K602 Anal fissure, unspecified: Secondary | ICD-10-CM | POA: Diagnosis not present

## 2020-08-15 DIAGNOSIS — Z86010 Personal history of colonic polyps: Secondary | ICD-10-CM | POA: Diagnosis not present

## 2020-08-15 DIAGNOSIS — K6289 Other specified diseases of anus and rectum: Secondary | ICD-10-CM | POA: Diagnosis not present

## 2020-08-15 HISTORY — DX: Personal history of other specified conditions: Z87.898

## 2020-08-15 HISTORY — PX: SPHINCTEROTOMY: SHX5279

## 2020-08-15 HISTORY — DX: Dyspnea, unspecified: R06.00

## 2020-08-15 HISTORY — DX: Allergic rhinitis, unspecified: J30.9

## 2020-08-15 HISTORY — DX: Chronic anal fissure: K60.1

## 2020-08-15 HISTORY — DX: Gastro-esophageal reflux disease without esophagitis: K21.9

## 2020-08-15 SURGERY — SPHINCTEROTOMY, ANAL
Anesthesia: Monitor Anesthesia Care | Site: Rectum

## 2020-08-15 MED ORDER — KETAMINE HCL 10 MG/ML IJ SOLN
INTRAMUSCULAR | Status: DC | PRN
  Administered 2020-08-15: 10 mg via INTRAVENOUS

## 2020-08-15 MED ORDER — ONDANSETRON HCL 4 MG/2ML IJ SOLN
INTRAMUSCULAR | Status: DC | PRN
  Administered 2020-08-15: 4 mg via INTRAVENOUS

## 2020-08-15 MED ORDER — FENTANYL CITRATE (PF) 100 MCG/2ML IJ SOLN: 25.0000 ug | INTRAMUSCULAR | Status: DC | PRN

## 2020-08-15 MED ORDER — SODIUM CHLORIDE 0.9 % IV SOLN: 250.0000 mL | INTRAVENOUS | Status: DC | PRN

## 2020-08-15 MED ORDER — PROPOFOL 500 MG/50ML IV EMUL
INTRAVENOUS | Status: DC | PRN
  Administered 2020-08-15: 150 ug via INTRAVENOUS
  Administered 2020-08-15: 50 ug via INTRAVENOUS
  Administered 2020-08-15: 100 ug via INTRAVENOUS

## 2020-08-15 MED ORDER — MIDAZOLAM HCL 2 MG/2ML IJ SOLN
INTRAMUSCULAR | Status: AC
  Filled 2020-08-15: qty 2

## 2020-08-15 MED ORDER — OXYCODONE HCL 5 MG PO TABS: 5.0000 mg | ORAL_TABLET | ORAL | Status: DC | PRN

## 2020-08-15 MED ORDER — ACETAMINOPHEN 325 MG PO TABS: 650.0000 mg | ORAL_TABLET | ORAL | Status: DC | PRN

## 2020-08-15 MED ORDER — MIDAZOLAM HCL 5 MG/5ML IJ SOLN
INTRAMUSCULAR | Status: DC | PRN
  Administered 2020-08-15: 2 mg via INTRAVENOUS

## 2020-08-15 MED ORDER — LACTATED RINGERS IV SOLN: INTRAVENOUS | Status: DC

## 2020-08-15 MED ORDER — PROPOFOL 10 MG/ML IV BOLUS
INTRAVENOUS | Status: AC
  Filled 2020-08-15: qty 20

## 2020-08-15 MED ORDER — PROPOFOL 500 MG/50ML IV EMUL
INTRAVENOUS | Status: AC
  Filled 2020-08-15: qty 50

## 2020-08-15 MED ORDER — FENTANYL CITRATE (PF) 250 MCG/5ML IJ SOLN
INTRAMUSCULAR | Status: DC | PRN
  Administered 2020-08-15: 50 ug via INTRAVENOUS

## 2020-08-15 MED ORDER — TRAMADOL HCL 50 MG PO TABS: 50.0000 mg | ORAL_TABLET | Freq: Four times a day (QID) | ORAL | 0 refills | Status: DC | PRN

## 2020-08-15 MED ORDER — ACETAMINOPHEN 500 MG PO TABS
1000.0000 mg | ORAL_TABLET | ORAL | Status: AC
  Administered 2020-08-15: 1000 mg via ORAL

## 2020-08-15 MED ORDER — PROPOFOL 10 MG/ML IV BOLUS
INTRAVENOUS | Status: DC | PRN
  Administered 2020-08-15: 40 mg via INTRAVENOUS

## 2020-08-15 MED ORDER — SODIUM CHLORIDE 0.9% FLUSH: 3.0000 mL | Freq: Two times a day (BID) | INTRAVENOUS | Status: DC

## 2020-08-15 MED ORDER — LIDOCAINE 5 % EX OINT
TOPICAL_OINTMENT | CUTANEOUS | Status: DC | PRN
  Administered 2020-08-15: 1

## 2020-08-15 MED ORDER — BUPIVACAINE-EPINEPHRINE 0.5% -1:200000 IJ SOLN
INTRAMUSCULAR | Status: DC | PRN
  Administered 2020-08-15: 30 mL

## 2020-08-15 MED ORDER — SODIUM CHLORIDE (PF) 0.9 % IJ SOLN
INTRAMUSCULAR | Status: DC | PRN
  Administered 2020-08-15: 50 mL

## 2020-08-15 MED ORDER — KETAMINE HCL 10 MG/ML IJ SOLN
INTRAMUSCULAR | Status: AC
  Filled 2020-08-15: qty 1

## 2020-08-15 MED ORDER — ONABOTULINUMTOXINA 100 UNITS IJ SOLR
INTRAMUSCULAR | Status: DC | PRN
  Administered 2020-08-15: 100 [IU] via INTRAMUSCULAR

## 2020-08-15 MED ORDER — PROMETHAZINE HCL 25 MG/ML IJ SOLN: 6.2500 mg | INTRAMUSCULAR | Status: DC | PRN

## 2020-08-15 MED ORDER — ACETAMINOPHEN 500 MG PO TABS
ORAL_TABLET | ORAL | Status: AC
  Filled 2020-08-15: qty 2

## 2020-08-15 MED ORDER — CELECOXIB 200 MG PO CAPS
ORAL_CAPSULE | ORAL | Status: AC
  Filled 2020-08-15: qty 1

## 2020-08-15 MED ORDER — SODIUM CHLORIDE 0.9% FLUSH
3.0000 mL | INTRAVENOUS | Status: DC | PRN
Start: 2020-08-15 — End: 2020-08-15

## 2020-08-15 MED ORDER — ONDANSETRON HCL 4 MG/2ML IJ SOLN
INTRAMUSCULAR | Status: AC
  Filled 2020-08-15: qty 2

## 2020-08-15 MED ORDER — ACETAMINOPHEN 325 MG RE SUPP: 650.0000 mg | RECTAL | Status: DC | PRN

## 2020-08-15 MED ORDER — FENTANYL CITRATE (PF) 100 MCG/2ML IJ SOLN
INTRAMUSCULAR | Status: AC
  Filled 2020-08-15: qty 2

## 2020-08-15 MED ORDER — CELECOXIB 200 MG PO CAPS
200.0000 mg | ORAL_CAPSULE | ORAL | Status: AC
  Administered 2020-08-15: 200 mg via ORAL

## 2020-08-15 SURGICAL SUPPLY — 40 items
APL SKNCLS STERI-STRIP NONHPOA (GAUZE/BANDAGES/DRESSINGS) ×1
BENZOIN TINCTURE PRP APPL 2/3 (GAUZE/BANDAGES/DRESSINGS) ×2 IMPLANT
BLADE HEX COATED 2.75 (ELECTRODE) ×2 IMPLANT
BLADE SURG 15 STRL LF DISP TIS (BLADE) ×1 IMPLANT
BLADE SURG 15 STRL SS (BLADE) ×2
BRIEF STRETCH FOR OB PAD LRG (UNDERPADS AND DIAPERS) ×2 IMPLANT
COVER BACK TABLE 60X90IN (DRAPES) ×2 IMPLANT
COVER LIGHT HANDLE STERIS (MISCELLANEOUS) ×2 IMPLANT
COVER MAYO STAND STRL (DRAPES) ×2 IMPLANT
COVER WAND RF STERILE (DRAPES) ×2 IMPLANT
DRAPE LAPAROTOMY 100X72 PEDS (DRAPES) ×2 IMPLANT
DRAPE UTILITY XL STRL (DRAPES) ×2 IMPLANT
DRSG PAD ABDOMINAL 8X10 ST (GAUZE/BANDAGES/DRESSINGS) ×2 IMPLANT
ELECT REM PT RETURN 9FT ADLT (ELECTROSURGICAL) ×2
ELECTRODE REM PT RTRN 9FT ADLT (ELECTROSURGICAL) ×1 IMPLANT
GAUZE SPONGE 4X4 12PLY STRL (GAUZE/BANDAGES/DRESSINGS) ×2 IMPLANT
GLOVE BIO SURGEON STRL SZ 6.5 (GLOVE) ×4 IMPLANT
GLOVE BIOGEL PI IND STRL 7.0 (GLOVE) ×2 IMPLANT
GLOVE BIOGEL PI IND STRL 7.5 (GLOVE) ×1 IMPLANT
GLOVE BIOGEL PI INDICATOR 7.0 (GLOVE) ×2
GLOVE BIOGEL PI INDICATOR 7.5 (GLOVE) ×1
GOWN STRL REUS W/ TWL LRG LVL3 (GOWN DISPOSABLE) ×1 IMPLANT
GOWN STRL REUS W/TWL LRG LVL3 (GOWN DISPOSABLE) ×2
KIT SIGMOIDOSCOPE (SET/KITS/TRAYS/PACK) IMPLANT
KIT TURNOVER CYSTO (KITS) ×2 IMPLANT
NDL SAFETY ECLIPSE 18X1.5 (NEEDLE) IMPLANT
NEEDLE HYPO 18GX1.5 SHARP (NEEDLE)
NEEDLE HYPO 22GX1.5 SAFETY (NEEDLE) ×2 IMPLANT
NS IRRIG 500ML POUR BTL (IV SOLUTION) ×2 IMPLANT
PACK BASIN DAY SURGERY FS (CUSTOM PROCEDURE TRAY) ×2 IMPLANT
PAD ARMBOARD 7.5X6 YLW CONV (MISCELLANEOUS) IMPLANT
SUT CHROMIC 2 0 SH (SUTURE) IMPLANT
SUT CHROMIC 3 0 SH 27 (SUTURE) IMPLANT
SYR CONTROL 10ML LL (SYRINGE) ×2 IMPLANT
TOWEL OR 17X26 10 PK STRL BLUE (TOWEL DISPOSABLE) ×2 IMPLANT
TRAP FLUID SMOKE EVACUATOR (MISCELLANEOUS) ×2 IMPLANT
TUBE CONNECTING 12X1/4 (SUCTIONS) ×2 IMPLANT
UNDERPAD 30X36 HEAVY ABSORB (UNDERPADS AND DIAPERS) ×2 IMPLANT
WATER STERILE IRR 500ML POUR (IV SOLUTION) ×2 IMPLANT
YANKAUER SUCT BULB TIP NO VENT (SUCTIONS) ×2 IMPLANT

## 2020-08-15 NOTE — Transfer of Care (Signed)
Immediate Anesthesia Transfer of Care Note  Patient: Joel Berry  Procedure(s) Performed: CHEMICAL SPHINCTEROTOMY, FISSURECTOMY (N/A Rectum)  Patient Location: PACU  Anesthesia Type:MAC  Level of Consciousness: awake, alert , oriented and patient cooperative  Airway & Oxygen Therapy: Patient Spontanous Breathing  Post-op Assessment: Report given to RN and Post -op Vital signs reviewed and stable  Post vital signs: Reviewed and stable  Last Vitals:  Vitals Value Taken Time  BP 126/86 08/15/20 0755  Temp    Pulse 82 08/15/20 0757  Resp 12 08/15/20 0757  SpO2 93 % 08/15/20 0757  Vitals shown include unvalidated device data.  Last Pain:  Vitals:   08/15/20 0558  TempSrc: Oral  PainSc: 0-No pain      Patients Stated Pain Goal: 3 (09/81/19 1478)  Complications: No complications documented.

## 2020-08-15 NOTE — Discharge Instructions (Addendum)
ANORECTAL SURGERY: POST OP INSTRUCTIONS 1. Take your usually prescribed home medications unless otherwise directed. 2. DIET: During the first few hours after surgery sip on some liquids until you are able to urinate.  It is normal to not urinate for several hours after this surgery.  If you feel uncomfortable, please contact the office for instructions.  After you are able to urinate,you may eat, if you feel like it.  Follow a light bland diet the first 24 hours after arrival home, such as soup, liquids, crackers, etc.  Be sure to include lots of fluids daily (6-8 glasses).  Avoid fast food or heavy meals, as your are more likely to get nauseated.  Eat a low fat diet the next few days after surgery.  Limit caffeine intake to 1-2 servings a day. 3. PAIN CONTROL: a. Pain is best controlled by a usual combination of several different methods TOGETHER: i. Muscle relaxation: Soak in a warm bath (or Sitz bath) three times a day and after bowel movements.  Continue to do this until all pain is resolved. ii. Over the counter pain medication * May take next dose of Tylenol or NSAID (Ibuprofen, Naproxen) after 12:00 PM Noon today if needed.  iii. Prescription pain medication b. Most patients will experience some swelling and discomfort in the anus/rectal area and incisions.  Heat such as warm towels, sitz baths, warm baths, etc to help relax tight/sore spots and speed recovery.  Some people prefer to use ice, especially in the first couple days after surgery, as it may decrease the pain and swelling, or alternate between ice & heat.  Experiment to what works for you.  Swelling and bruising can take several weeks to resolve.  Pain can take even longer to completely resolve. c. It is helpful to take an over-the-counter pain medication regularly for the first few weeks.  Choose one of the following that works best for you: i. Naproxen (Aleve, etc)  Two 220mg  tabs twice a day ii. Ibuprofen (Advil, etc) Three 200mg   tabs four times a day (every meal & bedtime) d. A  prescription for pain medication (such as percocet, oxycodone, hydrocodone, etc) should be given to you upon discharge.  Take your pain medication as prescribed.  i. If you are having problems/concerns with the prescription medicine (does not control pain, nausea, vomiting, rash, itching, etc), please call us [phone] to see if we need to switch you to a different pain medicine that will work better for you and/or control your side effect better. ii. If you need a refill on your pain medication, please contact your pharmacy.  They will contact our office to request authorization. Prescriptions will not be filled after 5 pm or on week-ends. 4. KEEP YOUR BOWELS REGULAR and AVOID CONSTIPATION a. The goal is one to two soft bowel movements a day.  You should at least have a bowel movement every other day. b. Avoid getting constipated.  Between the surgery and the pain medications, it is common to experience some constipation. This can be very painful after rectal surgery.  Increasing fluid intake and taking a fiber supplement (such as Metamucil, Citrucel, FiberCon, etc) 1-2 times a day regularly will usually help prevent this problem from occurring.  A stool softener like colace is also recommended.  This can be purchased over the counter at your pharmacy.  You can take it up to 3 times a day.  If you do not have a bowel movement after 24 hrs since your surgery,  take one does of milk of magnesia.  If you still haven't had a bowel movement 8-12 hours after that dose, take another dose.  If you don't have a bowel movement 48 hrs after surgery, purchase a Fleets enema from the drug store and administer gently per package instructions.  If you still are having trouble with your bowel movements after that, please call the office for further instructions. c. If you develop diarrhea or have many loose bowel movements, simplify your diet to bland foods & liquids  for a few days.  Stop any stool softeners and decrease your fiber supplement.  Switching to mild anti-diarrheal medications (Kayopectate, Pepto Bismol) can help.  If this worsens or does not improve, please call us.  5. Wound Care a. Remove your bandages before your first bowel movement or 8 hours after surgery.     b. Remove any wound packing material at this tim,e as well.  You do not need to repack the wound unless instructed otherwise.  Wear an absorbent pad or soft cotton gauze in your underwear to catch any drainage and help keep the area clean. You should change this every 2-3 hours while awake. c. Keep the area clean and dry.  Bathe / shower every day, especially after bowel movements.  Keep the area clean by showering / bathing over the incision / wound.   It is okay to soak an open wound to help wash it.  Wet wipes or showers / gentle washing after bowel movements is often less traumatic than regular toilet paper. d. Dennis Bast may have some styrofoam-like soft packing in the rectum which will come out with the first bowel movement.  e. You will often notice bleeding with bowel movements.  This should slow down by the end of the first week of surgery f. Expect some drainage.  This should slow down, too, by the end of the first week of surgery.  Wear an absorbent pad or soft cotton gauze in your underwear until the drainage stops. g. Do Not sit on a rubber or pillow ring.  This can make you symptoms worse.  You may sit on a soft pillow if needed.  6. ACTIVITIES as tolerated:   a. You may resume regular (light) daily activities beginning the next day--such as daily self-care, walking, climbing stairs--gradually increasing activities as tolerated.  If you can walk 30 minutes without difficulty, it is safe to try more intense activity such as jogging, treadmill, bicycling, low-impact aerobics, swimming, etc. b. Save the most intensive and strenuous activity for last such as sit-ups, heavy lifting, contact  sports, etc  Refrain from any heavy lifting or straining until you are off narcotics for pain control.   c. You may drive when you are no longer taking prescription pain medication, you can comfortably sit for long periods of time, and you can safely maneuver your car and apply brakes. d. Dennis Bast may have sexual intercourse when it is comfortable.  7. FOLLOW UP in our office a. Please call [facility] at [phone] to set up an appointment to see your surgeon in the office for a follow-up appointment approximately 3-4 weeks after your surgery. b. Make sure that you call for this appointment the day you arrive home to insure a convenient appointment time. 10. IF YOU HAVE DISABILITY OR FAMILY LEAVE FORMS, BRING THEM TO THE OFFICE FOR PROCESSING.  DO NOT GIVE THEM TO YOUR DOCTOR.     WHEN TO CALL us [phone]: 1. Poor pain control  2. Reactions / problems with new medications (rash/itching, nausea, etc)  3. Fever over 101.5 F (38.5 C) 4. Inability to urinate 5. Nausea and/or vomiting 6. Worsening swelling or bruising 7. Continued bleeding from incision. 8. Increased pain, redness, or drainage from the incision  The clinic staff is available to answer your questions during regular business hours (8:30am-5pm).  Please don't hesitate to call and ask to speak to one of our nurses for clinical concerns.   A surgeon from [facility] is always on call at the hospitals   If you have a medical emergency, go to the nearest emergency room or call 911.    [facility], [place], [place], [place], [place]  [address] ? MAIN: [phone] ? TOLL FREE: [phone] ? FAX [phone] [email or web address]   Post Anesthesia Home Care Instructions  Activity: Get plenty of rest for the remainder of the day. A responsible individual must stay with you for 24 hours following the procedure.  For the next 24 hours, DO NOT: -Drive a car -Conservation officer, nature -Drink alcoholic beverages -Take any medication unless instructed by your physician -Make any legal decisions or sign important papers.  Meals: Start with liquid foods such as gelatin or soup. Progress to regular foods as tolerated. Avoid greasy, spicy, heavy foods. If nausea and/or vomiting occur, drink only clear liquids until the nausea and/or vomiting subsides. Call your physician if vomiting continues.  Special Instructions/Symptoms: Your throat may feel dry or sore from the anesthesia or the breathing tube placed in your throat during surgery. If this causes discomfort, gargle with warm salt water. The discomfort should disappear within 24 hours.

## 2020-08-15 NOTE — Interval H&P Note (Signed)
History and Physical Interval Note:  08/15/2020 7:14 AM  Joel Berry  has presented today for surgery, with the diagnosis of ANAL FISSURE.  The various methods of treatment have been discussed with the patient and family. After consideration of risks, benefits and other options for treatment, the patient has consented to  Procedure(s): CHEMICAL SPHINCTEROTOMY, FISSURECTOMY (N/A) as a surgical intervention.  The patient's history has been reviewed, patient examined, no change in status, stable for surgery.  I have reviewed the patient's chart and labs.  Questions were answered to the patient's satisfaction.     Rosario Adie, MD  [facility]

## 2020-08-15 NOTE — Anesthesia Postprocedure Evaluation (Signed)
Anesthesia Post Note  Patient: Joel Berry  Procedure(s) Performed: CHEMICAL SPHINCTEROTOMY, FISSURECTOMY (N/A Rectum)     Patient location during evaluation: PACU Anesthesia Type: MAC Level of consciousness: awake and alert Pain management: pain level controlled Vital Signs Assessment: post-procedure vital signs reviewed and stable Respiratory status: spontaneous breathing and respiratory function stable Cardiovascular status: stable Postop Assessment: no apparent nausea or vomiting Anesthetic complications: no   No complications documented.  Last Vitals:  Vitals:   08/15/20 0830 08/15/20 0922  BP: 125/82 122/70  Pulse: 65 74  Resp: 17 14  Temp: 36.5 C 36.6 C  SpO2: 94% 99%    Last Pain:  Vitals:   08/15/20 0900  TempSrc:   PainSc: 0-No pain                 Arabell Neria DANIEL

## 2020-08-15 NOTE — Op Note (Signed)
08/15/2020  7:48 AM  PATIENT:  Joel Berry  39 y.o. male  Patient Care Team: Leslie Andrea, MD as PCP - General (Family Medicine) Eloise Harman, DO as Consulting Physician (Internal Medicine)  PRE-OPERATIVE DIAGNOSIS:  CHRONIC ANAL FISSURE  POST-OPERATIVE DIAGNOSIS:  CHRONIC ANAL FISSURE  PROCEDURE:  CHEMICAL SPHINCTEROTOMY, FISSURECTOMY    Surgeon(s): Leighton Ruff, MD  ASSISTANT: none   ANESTHESIA:   local and MAC  SPECIMEN:  Source of Specimen:  anal fissure  DISPOSITION OF SPECIMEN:  PATHOLOGY  COUNTS:  YES  PLAN OF CARE: Discharge to home after PACU  PATIENT DISPOSITION:  PACU - hemodynamically stable.  INDICATION: 39 y.o. M with chronic anal fissure   OR FINDINGS: Large posterior midline anal fissure with rolled borders  DESCRIPTION: the patient was identified in the preoperative holding area and taken to the OR where they were laid on the operating room table.  MAC anesthesia was induced without difficulty. The patient was then positioned in prone jackknife position with buttocks gently taped apart.  The patient was then prepped and draped in usual sterile fashion.  SCDs were noted to be in place prior to the initiation of anesthesia. A surgical timeout was performed indicating the correct patient, procedure, positioning and need for preoperative antibiotics.  A rectal block was performed using Marcaine with epinephrine.    I began with a digital rectal exam.  There was minimal sphincter hypertension.  I then placed a Hill-Ferguson anoscope into the anal canal and evaluated this completely.  There was a large anal fissure at posterior midline with rolled, chronically inflamed borders.  The edges were resected with Metzenbaum scissors and sent to pathology for further examination.  Hemostasis was achieved using electrocautery and Marcaine with epinephrine.  I then injected 100 units of Botox into the intersphincteric groove circumferentially.  The patient  tolerated this well.  Lidocaine ointment and a dressing was applied.  The patient was awakened from anesthesia and sent to the postanesthesia care unit in stable condition.  All counts were correct per operating room staff.

## 2020-08-16 ENCOUNTER — Encounter (HOSPITAL_BASED_OUTPATIENT_CLINIC_OR_DEPARTMENT_OTHER): Payer: Self-pay | Admitting: General Surgery

## 2020-08-16 ENCOUNTER — Ambulatory Visit (INDEPENDENT_AMBULATORY_CARE_PROVIDER_SITE_OTHER): Payer: BC Managed Care – PPO | Admitting: Family

## 2020-08-16 VITALS — BP 118/76 | HR 64 | Temp 98.3°F | Resp 18 | Ht 70.4 in | Wt 231.4 lb

## 2020-08-16 DIAGNOSIS — J3089 Other allergic rhinitis: Secondary | ICD-10-CM | POA: Diagnosis not present

## 2020-08-16 DIAGNOSIS — R0600 Dyspnea, unspecified: Secondary | ICD-10-CM

## 2020-08-16 DIAGNOSIS — L237 Allergic contact dermatitis due to plants, except food: Secondary | ICD-10-CM | POA: Diagnosis not present

## 2020-08-16 DIAGNOSIS — J302 Other seasonal allergic rhinitis: Secondary | ICD-10-CM | POA: Diagnosis not present

## 2020-08-16 LAB — SURGICAL PATHOLOGY

## 2020-08-16 NOTE — Progress Notes (Signed)
[place], [place] [address] Dept: [phone]  FOLLOW UP NOTE  Patient ID: Joel Berry, male    DOB: 08-02-1981  Age: 39 y.o. MRN: [id number] Date of Office Visit: 08/16/2020  Assessment  Chief Complaint: No chief complaint on file.  HPI KADIR AZUCENA is a 39 year old male who presents today for follow-up of seasonal and perennial allergic rhinitis and allergic contact dermatitis due to plants.  He was last seen on May 19, 2020 by Dr. Ernst Bowler.  Since his last office visit he had surgery yesterday by Dr. Leighton Ruff for anal fissure.  Seasonal and perennial allergic rhinitis is reported as not well controlled with Xyzal 5 mg twice a day and Singulair 10 mg once a day.  He reports that Singulair is causing him to have vivid dreams that are at times rough.  He also cannot tell that Xyzal is helping his symptoms any.  He has not tried saline rinses, because they make him" nervous".  He is concerned about him having well water.  Discussed if he were to try sinus rinses to use distilled water and not well water.  Discussed allergy injections and that this is a 3 to 5-year commitment.  Allergic contact dermatitis is reported as controlled.  He reports that the poison ivy is gone now  He reports shortness of breath since July 2020 when he had pneumonia.  His shortness of breath is reported as occurring daily and occurs at rest.  He reports that he will try stretching his mouth and his body to get some relief.  He reports that he is even been to the emergency room in July due to shortness of breath and chest pain.  He had a chest x-ray and CT angio chest PE that showed the following:  CLINICAL DATA:  39 year old male with history of chest pain.  EXAM: CHEST - 2 VIEW  COMPARISON:  Chest x-ray 04/29/2019.  FINDINGS: Lung volumes are normal. No consolidative airspace disease. No pleural effusions. No pneumothorax. No pulmonary nodule or mass noted.  Pulmonary vasculature and the cardiomediastinal silhouette are within normal limits.  IMPRESSION: No radiographic evidence of acute cardiopulmonary disease.  CLINICAL DATA:  Chest pressure.  Shortness of breath.  EXAM: CT ANGIOGRAPHY CHEST WITH CONTRAST  TECHNIQUE: Multidetector CT imaging of the chest was performed using the standard protocol during bolus administration of intravenous contrast. Multiplanar CT image reconstructions and MIPs were obtained to evaluate the vascular anatomy.  CONTRAST:  139mL OMNIPAQUE IOHEXOL 350 MG/ML SOLN  COMPARISON:  None.  FINDINGS: Cardiovascular: Contrast injection is sufficient to demonstrate satisfactory opacification of the pulmonary arteries to the segmental level. There is no pulmonary embolus or evidence of right heart strain. The size of the main pulmonary artery is normal. Mild cardiomegaly. The course and caliber of the aorta are normal. There is no atherosclerotic calcification. Opacification decreased due to pulmonary arterial phase contrast bolus timing.  Mediastinum/Nodes:  --there are calcified mediastinal lymph nodes.  -- No hilar lymphadenopathy.  -- No axillary lymphadenopathy.  -- No supraclavicular lymphadenopathy.  -- Normal thyroid gland where visualized.  -  Unremarkable esophagus.  Lungs/Pleura: Airways are patent. No pleural effusion, lobar consolidation, pneumothorax or pulmonary infarction.  Upper Abdomen: Contrast bolus timing is not optimized for evaluation of the abdominal organs. There is hepatic steatosis. There is a right hepatic lobe cyst.  Musculoskeletal: No chest wall abnormality. No bony spinal canal stenosis.  Review of the MIP images confirms the above findings.  IMPRESSION:  1. No evidence of pulmonary embolism or other acute intrathoracic process. 2. Mild cardiomegaly. 3. Hepatic steatosis.   Drug Allergies:  No Known Allergies  Review of Systems: Review  of Systems  Constitutional: Negative for chills and fever.  HENT:       Reports constant clear rhinorrhea, postnasal drip,  Eyes:       Denies itchy watery eyes  Respiratory: Positive for shortness of breath. Negative for cough and wheezing.   Cardiovascular: Negative for chest pain and palpitations.  Gastrointestinal: Negative for abdominal pain and heartburn.  Genitourinary: Negative for dysuria.  Skin: Negative for itching and rash.  Neurological: Negative for headaches.  Endo/Heme/Allergies: Positive for environmental allergies.    Physical Exam: BP 118/76 (BP Location: Right Arm, Patient Position: Sitting, Cuff Size: Normal)    Pulse 64    Temp 98.3 F (36.8 C) (Temporal)    Resp 18    Ht 5' 10.4" (1.788 m)    Wt 231 lb 6.4 oz (105 kg)    SpO2 98%    BMI 32.83 kg/m    Physical Exam Constitutional:      Appearance: Normal appearance.  HENT:     Head: Normocephalic and atraumatic.     Comments: Pharynx normal, eyes normal, ears normal, nose lateral lower turbinates slightly edematous and moderately erythematous with no drainage noted.    Right Ear: Tympanic membrane, ear canal and external ear normal.     Left Ear: Tympanic membrane, ear canal and external ear normal.     Mouth/Throat:     Mouth: Mucous membranes are moist.     Pharynx: Oropharynx is clear.  Eyes:     Conjunctiva/sclera: Conjunctivae normal.  Cardiovascular:     Rate and Rhythm: Regular rhythm.     Heart sounds: Normal heart sounds.  Pulmonary:     Effort: Pulmonary effort is normal.     Breath sounds: Normal breath sounds.     Comments: Lungs clear to auscultation bilaterally Musculoskeletal:     Cervical back: Neck supple.  Skin:    General: Skin is warm.  Neurological:     Mental Status: He is alert and oriented to person, place, and time.  Psychiatric:        Mood and Affect: Mood normal.        Behavior: Behavior normal.        Thought Content: Thought content normal.        Judgment:  Judgment normal.     Diagnostics: FVC 4.45 L, FEV1 3.75 L.  Predicted FVC 6.26 L, FEV1 4.98 L.  Spirometry indicates mild restriction.  Status post bronchodilator response shows FVC 4.36 L, FEV1 3.70 L.  Spirometry indicates mild restriction with no clear dilator response.  Assessment and Plan: 1. Seasonal and perennial allergic rhinitis   2. Allergic contact dermatitis due to plants, except food   3. Dyspnea, unspecified type     No orders of the defined types were placed in this encounter.   Patient Instructions  Seasonal and perennial allergic rhinitis(grass, ragweed, weeds, tree, dust mite, cockroach) Continue Xyzal 5 mg 1-2 times a day as needed for runny nose or itching Stop Singulair 10 mg due to vivid/"rough" dreams May use saline nasal rinse as needed for nasal symptoms.  Consider allergy injections as a means of long-term control. - Allergy injections "re-train" and "reset" the immune system to ignore environmental allergens and decrease the resulting immune response to those allergens (sneezing, itchy watery eyes, runny nose, nasal congestion, etc).    -  Allergy injections improve symptoms in 75-85% of patients.   - We can discuss this more at the next appointment if the medications are not working for you.  Allergic contact dermatitis due to plants Stable now  Shortness of breath Continue albuterol 2 puffs every 4 hours as needed for cough,wheeze, tightness in chest, or shortness of breath Start Alvesco 80 mcg using 2 puffs twice a day with spacer. Samples given  Please let us know if this treatment plan is not working well for you Schedule a follow-up appointment in 4 weeks    Return in about 4 weeks (around 09/13/2020), or if symptoms worsen or fail to improve.    Thank you for the opportunity to care for this patient.  Please do not hesitate to contact me with questions.  Althea Charon, FNP [facility]

## 2020-09-11 NOTE — Patient Instructions (Addendum)
Seasonal and perennial allergic rhinitis(grass, ragweed, weeds, tree, dust mite, cockroach) Continue Xyzal 5 mg 1-2 times a day as needed for runny nose or itching Start azelastine nasal spray 1-2 sprays each nostril once a day as needed for runny nose/drainage Start Nasacort nasal spray 1-2 sprays each nostril once  A day as needed for stuffy nose May use saline nasal rinse as needed for nasal symptoms.  Consider allergy injections as a means of long-term control. - Allergy injections "re-train" and "reset" the immune system to ignore environmental allergens and decrease the resulting immune response to those allergens (sneezing, itchy watery eyes, runny nose, nasal congestion, etc).    - Allergy injections improve symptoms in 75-85% of patients.   - We can discuss this more at the next appointment if the medications are not working for you.  Allergic contact dermatitis due to plants Stable now  Shortness of breath Continue albuterol 2 puffs every 4 hours as needed for cough,wheeze, tightness in chest, or shortness of breath Re-start Alvesco 80 mcg using 2 puffs twice a day with spacer.   Please let us know if this treatment plan is not working well for you Schedule a follow-up appointment in 1-2 weeks

## 2020-09-13 ENCOUNTER — Other Ambulatory Visit: Payer: Self-pay

## 2020-09-13 ENCOUNTER — Other Ambulatory Visit: Payer: Self-pay | Admitting: Allergy & Immunology

## 2020-09-13 ENCOUNTER — Ambulatory Visit (INDEPENDENT_AMBULATORY_CARE_PROVIDER_SITE_OTHER): Payer: Managed Care, Other (non HMO) | Admitting: Family

## 2020-09-13 ENCOUNTER — Encounter: Payer: Self-pay | Admitting: Family

## 2020-09-13 VITALS — BP 118/76 | HR 68 | Temp 98.2°F | Resp 18

## 2020-09-13 DIAGNOSIS — J302 Other seasonal allergic rhinitis: Secondary | ICD-10-CM

## 2020-09-13 DIAGNOSIS — L237 Allergic contact dermatitis due to plants, except food: Secondary | ICD-10-CM

## 2020-09-13 DIAGNOSIS — J3089 Other allergic rhinitis: Secondary | ICD-10-CM | POA: Diagnosis not present

## 2020-09-13 DIAGNOSIS — R0600 Dyspnea, unspecified: Secondary | ICD-10-CM

## 2020-09-13 MED ORDER — TRIAMCINOLONE ACETONIDE 55 MCG/ACT NA AERO: INHALATION_SPRAY | NASAL | 3 refills | Status: DC

## 2020-09-13 MED ORDER — AZELASTINE HCL 0.1 % NA SOLN: NASAL | 5 refills | Status: AC

## 2020-09-13 NOTE — Progress Notes (Signed)
[place], [place] [address] Dept: [phone]  FOLLOW UP NOTE  Patient ID: Joel Berry, male    DOB: Jul 26, 1981  Age: 40 y.o. MRN: [id number] Date of Office Visit: 09/13/2020  Assessment  Chief Complaint: Allergic Rhinitis  and Shortness of Breath  HPI Joel Berry is a 40 year old male who presents today for follow-up of seasonal and perennial allergic rhinitis, allergic contact dermatitis due to plants, and dyspnea unspecified type.  He was last seen August 16, 2020 by Crist Fat.  Seasonal and perennial allergic rhinitis is reported as not well controlled with Xyzal 5 mg twice a day.  He reports clear rhinorrhea, nasal congestion, and postnasal drip.  In the past when he has used fluticasone nasal spray he reports that it did not help at all.  He developed" knife" and sinus pain and blood draining down the throat.  He has tried Zyrtec, Human resources officer, and Claritin in the past with no relief.  He is not using saline nasal rinse.  He is willing to try nasal sprays since he reports that he was not using proper technique the last time.  Allergic contact dermatitis to plants is reported as stable.  Dyspnea is reported as controlled.  He is no longer taking Alvesco 80 mcg 2 puffs twice a day.  While using this medication he did not think that it did anything.  He stopped Alvesco 80 mcg approximately 10 days ago when he was diagnosed with COVID-19.  He was given ivermectin to take for COVID-19 by his wife's primary care physician.  He reports," that he had it in his head that if I have COVID gunk in my throat that I did not want to go into my lungs and get pneumonia", so he stopped Alvesco.  He is no longer having dyspnea symptoms since having COVID-19.  He has not received COVID-19 vaccines, but reports that he now has the natural vaccine.   Drug Allergies:  No Known Allergies  Review of Systems: Review of Systems  Constitutional: Negative for chills and  fever.  HENT:       Reports post nasal drip, clear rhinorrhea, and nasal congestion  Eyes:       Denies itchy watery eyes  Respiratory: Negative for cough, shortness of breath and wheezing.   Cardiovascular: Negative for chest pain and palpitations.  Gastrointestinal: Negative for abdominal pain and heartburn.  Genitourinary: Negative for dysuria.  Skin: Negative for itching and rash.  Neurological: Negative for headaches.  Endo/Heme/Allergies: Positive for environmental allergies.    Physical Exam: BP 118/76 (BP Location: Right Arm, Patient Position: Sitting, Cuff Size: Normal)   Pulse 68   Temp 98.2 F (36.8 C) (Temporal)   Resp 18   SpO2 100%    Physical Exam Constitutional:      Appearance: He is well-developed.  HENT:     Head: Normocephalic and atraumatic.     Mouth/Throat:     Mouth: Mucous membranes are moist.     Pharynx: Oropharynx is clear.  Eyes:     Comments: Conjunctiva clear  Cardiovascular:     Rate and Rhythm: Normal rate and regular rhythm.  Pulmonary:     Effort: Pulmonary effort is normal.     Breath sounds: Normal breath sounds.     Comments: Lungs clear to auscultation Musculoskeletal:     Cervical back: Neck supple.  Skin:    General: Skin is warm.     Comments: No rashes or urticarial lesions noted  Neurological:     Mental Status: He is alert and oriented to person, place, and time.  Psychiatric:        Mood and Affect: Mood normal.        Behavior: Behavior normal.     Diagnostics:  Spirometry not completed today being on day 10 of COVID-19. Will repeat spirometry at next office visit in 1-2 weeks.  Assessment and Plan: 1. Seasonal and perennial allergic rhinitis   2. Allergic contact dermatitis due to plants, except food   3. Dyspnea, unspecified type     No orders of the defined types were placed in this encounter.   Patient Instructions  Seasonal and perennial allergic rhinitis(grass, ragweed, weeds, tree, dust mite,  cockroach) Continue Xyzal 5 mg 1-2 times a day as needed for runny nose or itching Start azelastine nasal spray 1-2 sprays each nostril once a day as needed for runny nose/drainage Start Nasacort nasal spray 1-2 sprays each nostril once  A day as needed for stuffy nose May use saline nasal rinse as needed for nasal symptoms.  Consider allergy injections as a means of long-term control. - Allergy injections "re-train" and "reset" the immune system to ignore environmental allergens and decrease the resulting immune response to those allergens (sneezing, itchy watery eyes, runny nose, nasal congestion, etc).    - Allergy injections improve symptoms in 75-85% of patients.   - We can discuss this more at the next appointment if the medications are not working for you.  Allergic contact dermatitis due to plants Stable now  Shortness of breath Continue albuterol 2 puffs every 4 hours as needed for cough,wheeze, tightness in chest, or shortness of breath Re-start Alvesco 80 mcg using 2 puffs twice a day with spacer.   Please let us know if this treatment plan is not working well for you Schedule a follow-up appointment in 1-2 weeks    Return in about 2 weeks (around 09/27/2020), or if symptoms worsen or fail to improve.    Thank you for the opportunity to care for this patient.  Please do not hesitate to contact me with questions.  Althea Charon, FNP [facility] and [facility]

## 2020-09-25 NOTE — Patient Instructions (Incomplete)
Seasonal and perennial allergic rhinitis(grass, ragweed, weeds, tree, dust mite, cockroach) Continue Xyzal 5 mg 1-2 times a day as needed for runny nose or itching Start azelastine 137 mcg nasal spray 1-2 sprays each nostril once a day as needed for runny nose/drainage Continue  Nasacort nasal spray 1-2 sprays each nostril once a day as needed for stuffy nose May use saline nasal rinse as needed for nasal symptoms.  Consider allergy injections as a means of long-term control. - Allergy injections "re-train" and "reset" the immune system to ignore environmental allergens and decrease the resulting immune response to those allergens (sneezing, itchy watery eyes, runny nose, nasal congestion, etc).    - Allergy injections improve symptoms in 75-85% of patients.   - We can discuss this more at the next appointment if the medications are not working for you.  Shortness of breath-stable Continue albuterol 2 puffs every 4 hours as needed for cough,wheeze, tightness in chest, or shortness of breath Stop Alvesco 80 mcg  For respiratory flares start Alvesco 80 mcg 2 puffs twice a day with spacer for 1-2 weeks or until symptoms return to baseline  Please let us know if this treatment plan is not working well for you Schedule a follow-up appointment in 2 months

## 2020-09-27 ENCOUNTER — Encounter: Payer: Self-pay | Admitting: Family

## 2020-09-27 ENCOUNTER — Ambulatory Visit (INDEPENDENT_AMBULATORY_CARE_PROVIDER_SITE_OTHER): Payer: Managed Care, Other (non HMO) | Admitting: Family

## 2020-09-27 ENCOUNTER — Other Ambulatory Visit: Payer: Self-pay

## 2020-09-27 VITALS — BP 122/78 | HR 68

## 2020-09-27 DIAGNOSIS — R0600 Dyspnea, unspecified: Secondary | ICD-10-CM

## 2020-09-27 DIAGNOSIS — J309 Allergic rhinitis, unspecified: Secondary | ICD-10-CM

## 2020-09-27 DIAGNOSIS — J302 Other seasonal allergic rhinitis: Secondary | ICD-10-CM

## 2020-09-27 DIAGNOSIS — J3089 Other allergic rhinitis: Secondary | ICD-10-CM

## 2020-09-27 MED ORDER — AZELASTINE HCL 137 MCG/SPRAY NA SOLN: NASAL | 3 refills | Status: DC

## 2020-09-27 NOTE — Progress Notes (Signed)
Joel Berry, Joel [place] [id number] Dept: [phone]  FOLLOW UP NOTE  Patient ID: Joel Berry, male    DOB: 12/15/1980  Age: 40 y.o. MRN: [id number] Date of Office Visit: 09/27/2020  Assessment  Chief Complaint: Allergic Rhinitis  (Follow up visit for spiro testing)  HPI BION Berry is a 40 year old male who presents today for follow-up of dyspnea and seasonal and perennial allergic rhinitis.  He was last seen by Althea Charon, FNP on September 13, 2020.  Dyspnea is reported as controlled with Alvesco 80 mcg 2 puffs twice a day with spacer.  At his last visit he had stopped his Alvesco 80 mcg when he was diagnosed with Covid 19.  Since having COVID-19 he reports that he no longer has dyspnea.  He reports occasional cough due to postnasal drip and denies wheezing, tightness in his chest, shortness of breath, and nocturnal awakenings due to breathing symptoms.  He has not had to use his albuterol any since his last office visit.  Also, since his last office visit he has not required any systemic steroids or made any trips to the emergency room or urgent care due to breathing problems.  Allergic rhinitis is reported as not well controlled with Xyzal 5 mg 1-2 times a day and Nasacort 1 to 2 sprays each nostril once a day as needed.  He was not able to pick up the azelastine 0.1% nasal spray due to the cost being $165.  He reports clear rhinorrhea, nasal congestion, postnasal drip, and occasional sinus tenderness.  He does not use any saline rinses.  Advised him if he does use saline rinses to not use well water and to buy distilled water.   Drug Allergies:  No Known Allergies  Review of Systems: Review of Systems  Constitutional: Negative for chills and fever.  HENT:       Reports clear rhinorrhea, nasal congestion, and occasional sinus tenderness  Eyes:       Denies itchy watery eyes  Respiratory: Positive for cough. Negative for shortness of breath and wheezing.         Reports cough due to post nasal drip  Cardiovascular: Negative for chest pain and palpitations.  Gastrointestinal: Negative for abdominal pain and heartburn.  Genitourinary: Negative for dysuria.  Skin: Negative for itching and rash.  Neurological: Negative for headaches.  Endo/Heme/Allergies: Positive for environmental allergies.    Physical Exam: BP 122/78 (BP Location: Right Arm, Patient Position: Sitting, Cuff Size: Normal)   Pulse 68   SpO2 98%    Physical Exam Constitutional:      Appearance: Normal appearance.  HENT:     Head: Normocephalic and atraumatic.     Comments: Eyes normal, ears normal, nose: Left nostril lower turbinate moderately edematous and slightly erythematous with no drainage noted.  Right nostril normal    Right Ear: Tympanic membrane, ear canal and external ear normal.     Left Ear: Tympanic membrane, ear canal and external ear normal.     Mouth/Throat:     Mouth: Mucous membranes are moist.     Pharynx: Oropharynx is clear.  Eyes:     Conjunctiva/sclera: Conjunctivae normal.  Cardiovascular:     Rate and Rhythm: Regular rhythm.     Heart sounds: Normal heart sounds.  Pulmonary:     Effort: Pulmonary effort is normal.     Breath sounds: Normal breath sounds.     Comments: Lungs clear to auscultation Musculoskeletal:     Cervical  back: Neck supple.  Skin:    General: Skin is warm.  Neurological:     Mental Status: He is alert and oriented to person, place, and time.  Psychiatric:        Mood and Affect: Mood normal.        Behavior: Behavior normal.        Thought Content: Thought content normal.        Judgment: Judgment normal.     Diagnostics: FVC 4.45 L, FEV1 3.93 L.  Predicted FVC 5.37 L, FEV1 4.30 L.  Spirometry indicates normal ventilatory function.  Assessment and Plan: 1. Seasonal and perennial allergic rhinitis   2. Dyspnea, unspecified type     Meds ordered this encounter  Medications  . Azelastine HCl 137 MCG/SPRAY  SOLN    Sig: Use 1-2 sprays each nostril once a day as needed for runny nose/drainage    Dispense:  30 mL    Refill:  3    Patient Instructions  Seasonal and perennial allergic rhinitis(grass, ragweed, weeds, tree, dust mite, cockroach) Continue Xyzal 5 mg 1-2 times a day as needed for runny nose or itching Start azelastine 137 mcg nasal spray 1-2 sprays each nostril once a day as needed for runny nose/drainage Continue  Nasacort nasal spray 1-2 sprays each nostril once a day as needed for stuffy nose May use saline nasal rinse as needed for nasal symptoms.  Consider allergy injections as a means of long-term control. - Allergy injections "re-train" and "reset" the immune system to ignore environmental allergens and decrease the resulting immune response to those allergens (sneezing, itchy watery eyes, runny nose, nasal congestion, etc).    - Allergy injections improve symptoms in 75-85% of patients.   - We can discuss this more at the next appointment if the medications are not working for you.  Shortness of breath-stable Continue albuterol 2 puffs every 4 hours as needed for cough,wheeze, tightness in chest, or shortness of breath Stop Alvesco 80 mcg  For respiratory flares start Alvesco 80 mcg 2 puffs twice a day with spacer for 1-2 weeks or until symptoms return to baseline  Please let us know if this treatment plan is not working well for you Schedule a follow-up appointment in 2 months    Return in about 2 months (around 11/25/2020), or if symptoms worsen or fail to improve.    Thank you for the opportunity to care for this patient.  Please do not hesitate to contact me with questions.  Althea Charon, FNP [facility] and [facility]

## 2020-09-27 NOTE — Addendum Note (Signed)
Addended by: Jaynie Crumble on: 09/27/2020 05:28 PM   Modules accepted: Orders

## 2020-10-06 NOTE — Addendum Note (Signed)
Addended by: Althea Charon on: 10/06/2020 04:45 PM   Modules accepted: Orders

## 2020-10-23 ENCOUNTER — Encounter: Payer: Self-pay | Admitting: Internal Medicine

## 2020-10-31 IMAGING — CT CT ANGIO CHEST
2 of 6 series · 18 of 46 positions shown · IV contrast (Omnipaque or Isovue)
Comparison: None.

CLINICAL DATA: Chest pressure.  Shortness of breath.

EXAM:
CT ANGIOGRAPHY CHEST WITH CONTRAST
TECHNIQUE: Multidetector CT imaging of the chest was performed using the
standard protocol during bolus administration of intravenous
contrast. Multiplanar CT image reconstructions and MIPs were
obtained to evaluate the vascular anatomy.
CONTRAST:  100mL OMNIPAQUE IOHEXOL 350 MG/ML SOLN

[Series 5: pe axial thins · axial · 0.68mm/px · z∈[+1059,+1319]mm · 15 of 286 slices shown]
[im 13/286  lung]
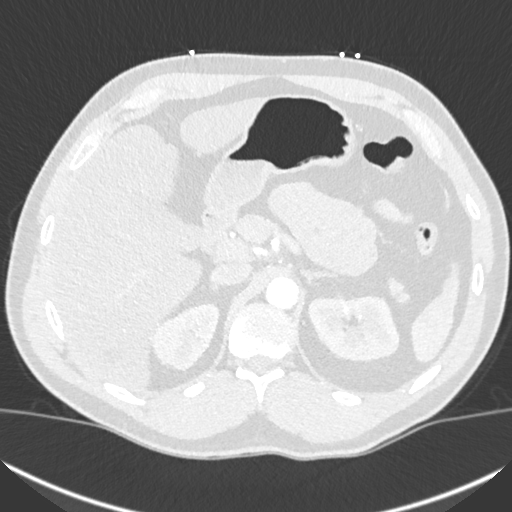
[im 38/286  soft-tissue]
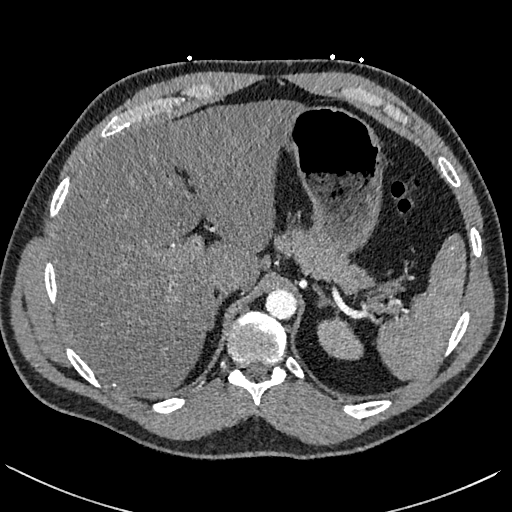
[im 50/286  lung]
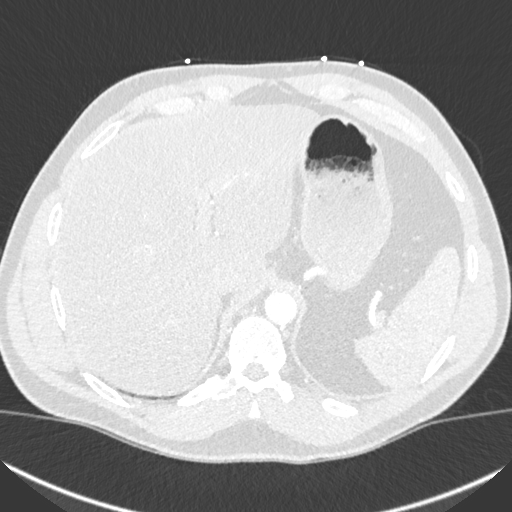
[im 75/286  soft-tissue]
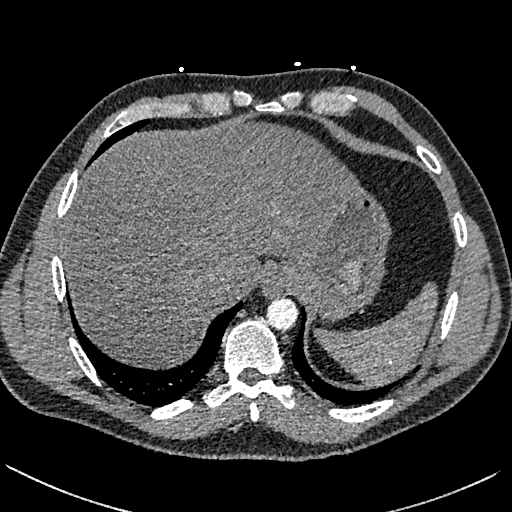
[im 87/286  lung]
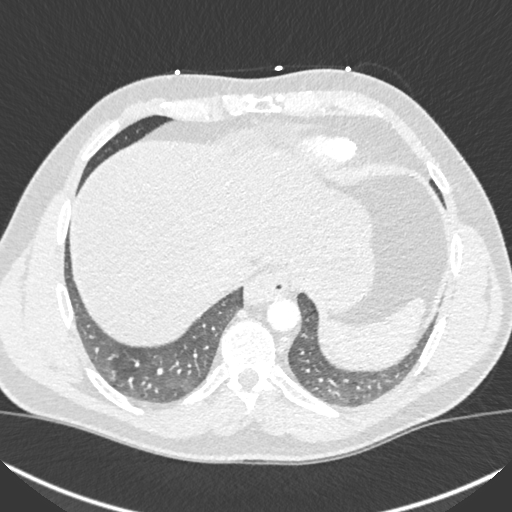
[im 112/286  soft-tissue]
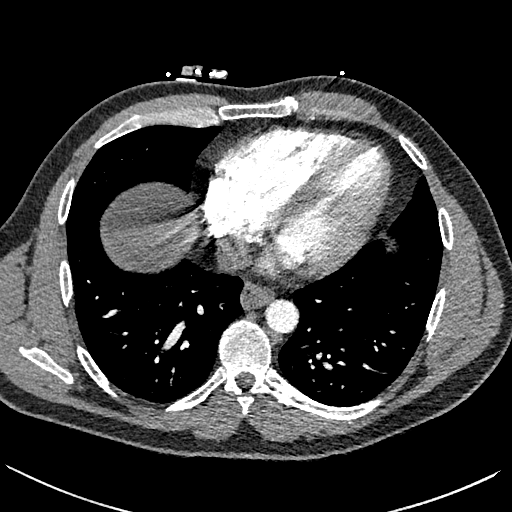
[im 124/286  lung]
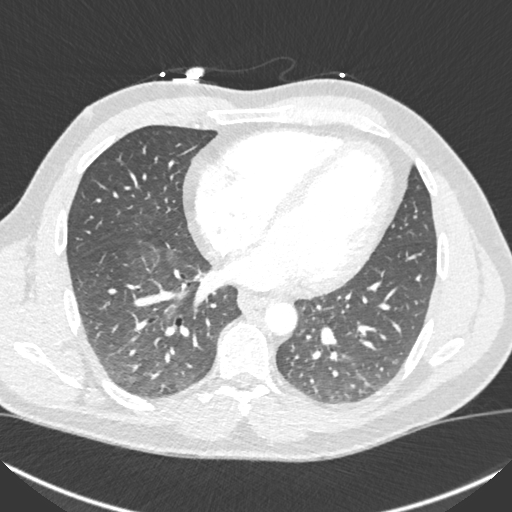
[im 149/286  soft-tissue]
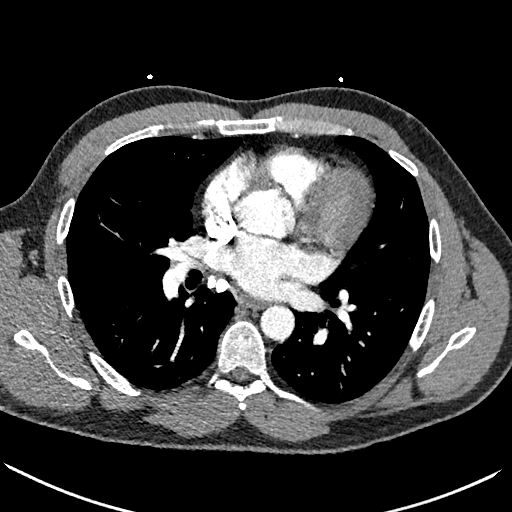
[im 162/286  lung]
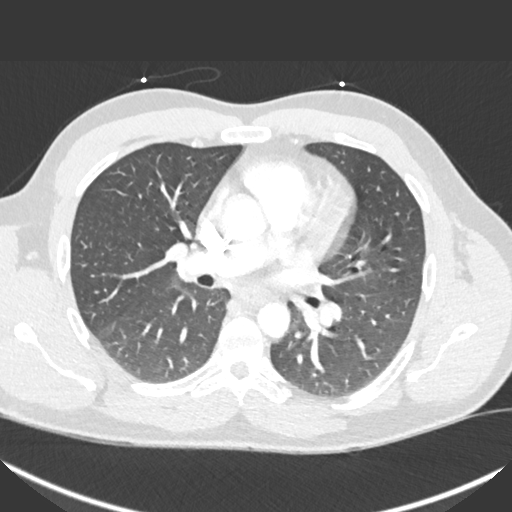
[im 174/286  soft-tissue]
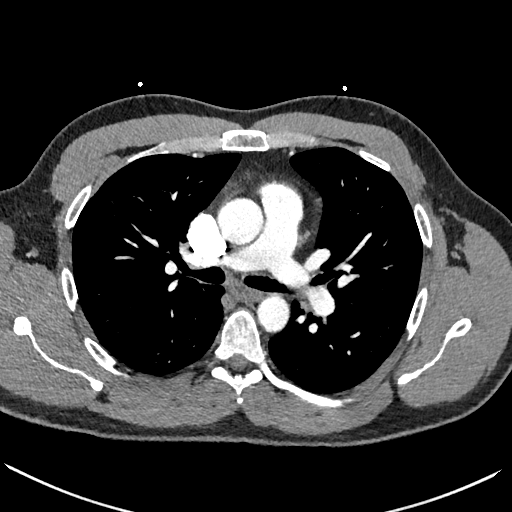
[im 199/286  lung]
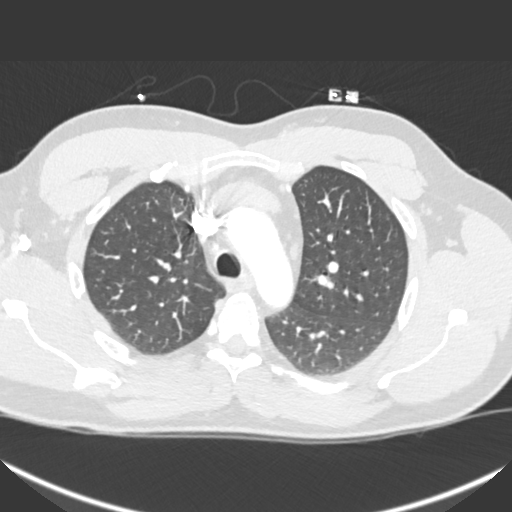
[im 211/286  soft-tissue]
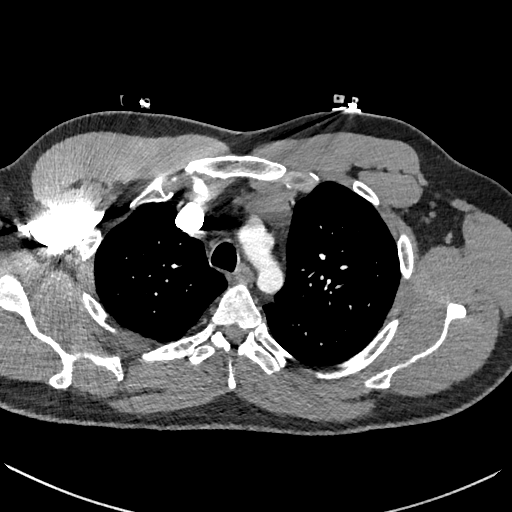
[im 236/286  lung]
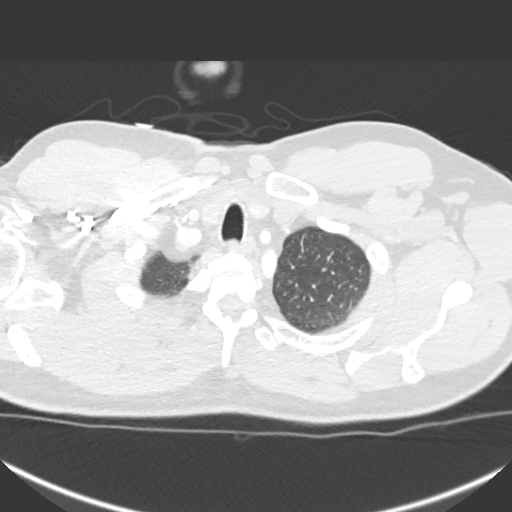
[im 248/286  soft-tissue]
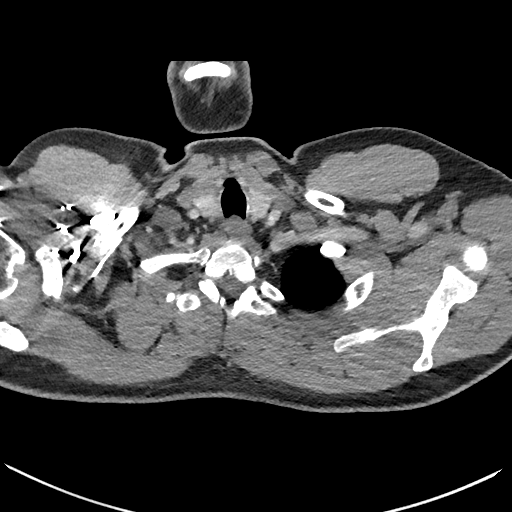
[im 273/286  lung]
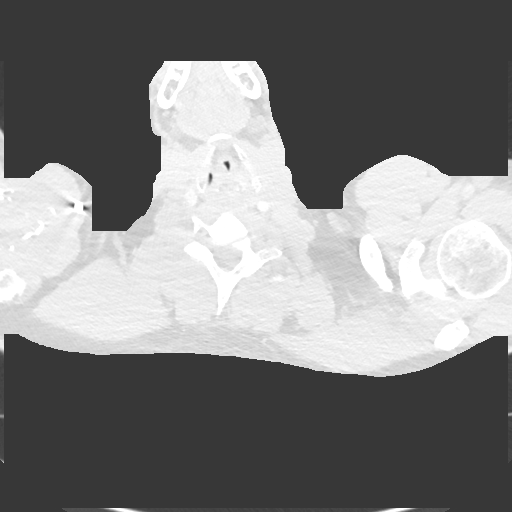

[Series 7: cor soft · coronal · 0.56mm/px · 3 of 151 slices shown]
[im 38/151  soft-tissue]
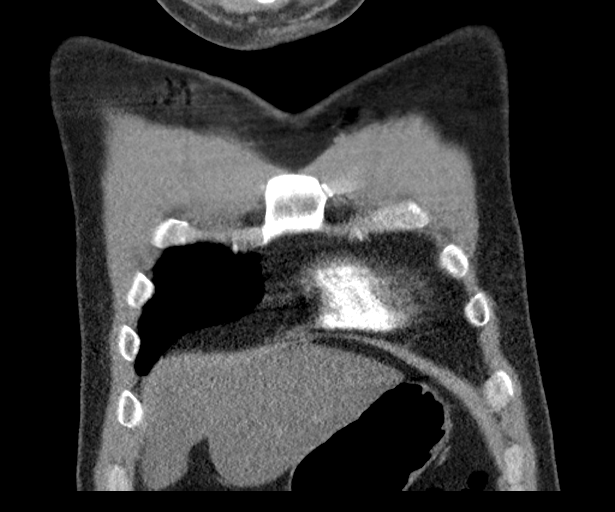
[im 76/151  soft-tissue]
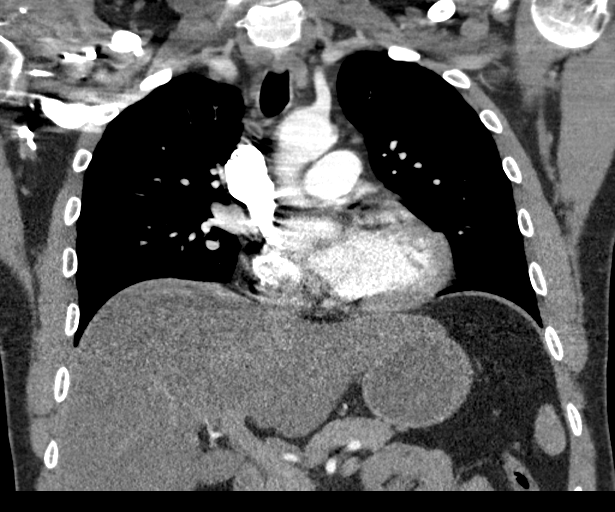
[im 113/151  soft-tissue]
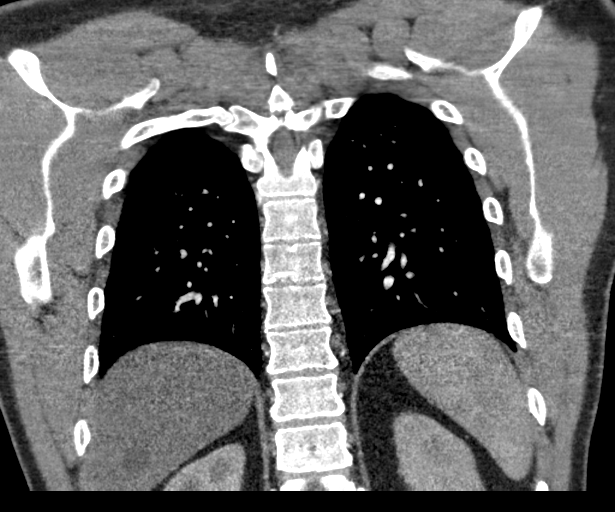

[18 of 46 positions shown; findings below may reference images not displayed]

FINDINGS: Cardiovascular: Contrast injection is sufficient to demonstrate
satisfactory opacification of the pulmonary arteries to the
segmental level. There is no pulmonary embolus or evidence of right
heart strain. The size of the main pulmonary artery is normal. Mild
cardiomegaly. The course and caliber of the aorta are normal. There
is no atherosclerotic calcification. Opacification decreased due to
pulmonary arterial phase contrast bolus timing.

Mediastinum/Nodes:

--there are calcified mediastinal lymph nodes.

-- No hilar lymphadenopathy.

-- No axillary lymphadenopathy.

-- No supraclavicular lymphadenopathy.

-- Normal thyroid gland where visualized.

-  Unremarkable esophagus.

Lungs/Pleura: Airways are patent. No pleural effusion, lobar
consolidation, pneumothorax or pulmonary infarction.

Upper Abdomen: Contrast bolus timing is not optimized for evaluation
of the abdominal organs. There is hepatic steatosis. There is a
right hepatic lobe cyst.

Musculoskeletal: No chest wall abnormality. No bony spinal canal
stenosis.

Review of the MIP images confirms the above findings.
IMPRESSION: 1. No evidence of pulmonary embolism or other acute intrathoracic
process.
2. Mild cardiomegaly.
3. Hepatic steatosis.

## 2020-11-22 ENCOUNTER — Encounter: Payer: Self-pay | Admitting: Allergy & Immunology

## 2020-11-22 ENCOUNTER — Ambulatory Visit (INDEPENDENT_AMBULATORY_CARE_PROVIDER_SITE_OTHER): Payer: Managed Care, Other (non HMO) | Admitting: Allergy & Immunology

## 2020-11-22 ENCOUNTER — Other Ambulatory Visit: Payer: Self-pay

## 2020-11-22 VITALS — BP 122/70 | HR 81 | Temp 98.3°F | Resp 16 | Ht 68.0 in | Wt 202.6 lb

## 2020-11-22 DIAGNOSIS — R0600 Dyspnea, unspecified: Secondary | ICD-10-CM | POA: Diagnosis not present

## 2020-11-22 DIAGNOSIS — J3089 Other allergic rhinitis: Secondary | ICD-10-CM

## 2020-11-22 DIAGNOSIS — L237 Allergic contact dermatitis due to plants, except food: Secondary | ICD-10-CM | POA: Diagnosis not present

## 2020-11-22 DIAGNOSIS — J302 Other seasonal allergic rhinitis: Secondary | ICD-10-CM | POA: Diagnosis not present
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
## 2020-11-22 NOTE — Patient Instructions (Addendum)
1. Seasonal and perennial allergic rhinitis (grasses, ragweed, weeds, trees, dust mites and cockroach) - Stop all of your current medications. - Try nasal ipratropium (Atrovent) up to three times daily (CAN be OVERDRYING, so watch out).  - I am going to get a release of information form for Dr. Deeann Saint office.  - If there are polyps, we could consider Dupixent.  - We could also consider allergy shots, although this is not a magic bullet by any means and requires weekly shot visits for a while.   2. Shortness of breath - resolved - Keep albuterol on hand to use as needed.   3. Return in about 6 weeks (around 01/03/2021).    Please inform us of any Emergency Department visits, hospitalizations, or changes in symptoms. Call us before going to the ED for breathing or allergy symptoms since we might be able to fit you in for a sick visit. Feel free to contact us anytime with any questions, problems, or concerns.  It was a pleasure to see you again today!  Websites that have reliable patient information: 1. American Academy of Asthma, Allergy, and Immunology: www.aaaai.org 2. Food Allergy Research and Education (FARE): foodallergy.org 3. Mothers of Asthmatics: http://www.asthmacommunitynetwork.org 4. American College of Allergy, Asthma, and Immunology: www.acaai.org   COVID-19 Vaccine Information can be found at: ShippingScam.co.uk For questions related to vaccine distribution or appointments, please email [email or web address] or call [phone].   We realize that you might be concerned about having an allergic reaction to the COVID19 vaccines. To help with that concern, WE ARE OFFERING THE COVID19 VACCINES IN OUR OFFICE! Ask the front desk for dates!     "Like" [place] on Facebook and Instagram for our latest updates!      A healthy democracy works best when [employer] participate! Make sure you are registered to vote! If you have  moved or changed any of your contact information, you will need to get this updated before voting!  In some cases, you MAY be able to register to vote online: [email or web address]

## 2020-11-24 NOTE — Progress Notes (Signed)
We received outside records from his otolaryngologist.  There is absolutely no mention of nasal polyps.  He did note a congested mucosa and enlarged turbinates.  He recommended a tonsillectomy and adenoidectomy.  This was performed in May 2021.  Per the surgery notes, he was noted to have bilateral cryptic tonsils with numerous tonsil stones.  Salvatore Marvel, MD All[facility] [place]

## 2021-01-05 ENCOUNTER — Ambulatory Visit: Payer: Managed Care, Other (non HMO) | Admitting: Allergy & Immunology

## 2021-04-15 NOTE — Progress Notes (Signed)
Cardiology Office Note:    Date:  04/16/2021   ID:  Joel Berry, DOB 1981-02-12, MRN [id number]  PCP:  Leslie Andrea, MD  Cardiologist:  None  Electrophysiologist:  None   Referring MD: Carlis Abbott, NP   Chief Complaint  Patient presents with   Palpitations     History of Present Illness:    Joel Berry is a 40 y.o. male with a hx of GERD, allergic rhinitis who is referred by Carlis Abbott, NP for evaluation of palpitations.  He reports that he has had COVID-19 twice, once in 20020 and again in 2022.  Following episode in 2022, he has had issues with shortness of breath.  Can occur at rest or with exertion.  He walks on the treadmill for 30 minutes/day but notices he is more limited due to shortness of breath recently.  He denies any chest pain.  Also reports has been having palpitations.  Describes feeling fluttering in chest that can last anywhere from a couple minutes to over 10 minutes.  Was happening daily but not in last 2 days.  Reports occasional lightheadedness, denies any syncope.  No lower extremity edema.  He drinks 1 thermos of coffee per day.  Rare alcohol use.  Does not notice correlation of palpitations with caffeine or alcohol.  No smoking history.  Family history includes maternal aunt died at age 59, found to have hypertrophic cardiomyopathy.   BP Readings from Last 3 Encounters:  04/16/21 140/89  11/22/20 122/70  09/27/20 122/78      Past Medical History:  Diagnosis Date   Allergic rhinitis    Chronic anal fissure    Dyspnea    08-08-2020  per pt has had sob since having  pneumonia 07/ 2020   Family history of adverse reaction to anesthesia    Daughter @ age 28--- ran fever with tonsils out, nausa/ vomiting, pt stated happened several hours after procedure   GERD (gastroesophageal reflux disease)    History of cardiac murmur as a child    History of chest pain 03/26/2020   ED visit in epic dx atypical chest pain/ sob,  was given prednisone  and albuterol inhaler as needed, ruled noncardiac  (08-08-2020  pt denies any chest pain since)   History of pneumonia 03/2019   per pt told was probable covid related even though tested negative   Pneumonia     Past Surgical History:  Procedure Laterality Date   BIOPSY  06/19/2020   Procedure: BIOPSY;  Surgeon: Eloise Harman, DO;  [facility];  Service: Endoscopy;;   COLONOSCOPY Bilateral    06/19/2020   COLONOSCOPY WITH PROPOFOL N/A 06/19/2020   Procedure: COLONOSCOPY WITH PROPOFOL;  Surgeon: Eloise Harman, DO;  [facility];  Service: Endoscopy;  Laterality: N/A;  1:00pm   ESOPHAGOGASTRODUODENOSCOPY (EGD) WITH PROPOFOL N/A 06/19/2020   Procedure: ESOPHAGOGASTRODUODENOSCOPY (EGD) WITH PROPOFOL;  Surgeon: Eloise Harman, DO;  [facility];  Service: Endoscopy;  Laterality: N/A;   POLYPECTOMY  06/19/2020   Procedure: POLYPECTOMY;  Surgeon: Eloise Harman, DO;  [facility];  Service: Endoscopy;;   SPHINCTEROTOMY N/A 08/15/2020   Procedure: CHEMICAL SPHINCTEROTOMY, FISSURECTOMY;  Surgeon: Leighton Ruff, MD;  Location: [facility];  Service: General;  Laterality: N/A;   TONSILLECTOMY AND ADENOIDECTOMY  12/2019    Current Medications: Current Meds  Medication Sig   azelastine (ASTELIN) 0.1 % nasal spray Use 1-2 sprays each nostril once a day as  needed for stuffy nose   omeprazole (PRILOSEC) 40 MG capsule Take 1 capsule (40 mg total) by mouth in the morning and at bedtime.   [DISCONTINUED] Azelastine HCl 137 MCG/SPRAY SOLN Use 1-2 sprays each nostril once a day as needed for runny nose/drainage     Allergies:   Patient has no known allergies.   Social History   Socioeconomic History   Marital status: Married    Spouse name: Not on file   Number of children: Not on file   Years of education: Not on file   Highest education level: Not on file  Occupational History   Not on file  Tobacco Use   Smoking  status: Never   Smokeless tobacco: Never  Vaping Use   Vaping Use: Never used  Substance and Sexual Activity   Alcohol use: Yes    Comment: occasional   Drug use: Never   Sexual activity: Not on file  Other Topics Concern   Not on file  Social History Narrative   Not on file   Social Determinants of Health   Financial Resource Strain: Not on file  Food Insecurity: Not on file  Transportation Needs: Not on file  Physical Activity: Not on file  Stress: Not on file  Social Connections: Not on file     Family History: The patient's family history includes Cancer in an other family member; Colon cancer in his maternal grandmother and mother; Hypertension in his father.  ROS:   Please see the history of present illness.     All other systems reviewed and are negative.  EKGs/Labs/Other Studies Reviewed:    The following studies were reviewed today:   EKG:  EKG is  ordered today.  The ekg ordered today demonstrates NSR, rate 61, no ST abnormalities  Recent Labs: No results found for requested labs within last 8760 hours.  Recent Lipid Panel No results found for: CHOL, TRIG, HDL, CHOLHDL, VLDL, LDLCALC, LDLDIRECT  Physical Exam:    VS:  BP 140/89   Pulse 61   Ht '5\' 8"'$  (1.727 m)   Wt 206 lb (93.4 kg)   SpO2 100%   BMI 31.32 kg/m     Wt Readings from Last 3 Encounters:  04/16/21 206 lb (93.4 kg)  11/22/20 202 lb 9.6 oz (91.9 kg)  08/16/20 231 lb 6.4 oz (105 kg)     GEN:  Well nourished, well developed in no acute distress HEENT: Normal NECK: No JVD; No carotid bruits LYMPHATICS: No lymphadenopathy CARDIAC: RRR, no murmurs, rubs, gallops RESPIRATORY:  Clear to auscultation without rales, wheezing or rhonchi  ABDOMEN: Soft, non-tender, non-distended MUSCULOSKELETAL:  No edema; No deformity  SKIN: Warm and dry NEUROLOGIC:  Alert and oriented x 3 PSYCHIATRIC:  Normal affect   ASSESSMENT:    1. Palpitations   2. Shortness of breath   3. Snoring   4.  Elevated BP without diagnosis of hypertension   5. Family history of hypertrophic cardiomyopathy    PLAN:    Palpitations: Description concerning for arrhythmia, will evaluate with Zio patch x2 weeks  Dyspnea: Has occurred since COVID-19 infection.  Will check echocardiogram to evaluate for myocardial involvement  Family history of hypertrophic cardiomyopathy: Check echocardiogram as above  Elevated BP: BP elevated in clinic today, no history of hypertension.  Recommend checking BP twice daily for next week and calling with results.  Snoring: Check sleep study  RTC in 6 months  Medication Adjustments/Labs and Tests Ordered: Current medicines are reviewed at length  with the patient today.  Concerns regarding medicines are outlined above.  Orders Placed This Encounter  Procedures   LONG TERM MONITOR (3-14 DAYS)   EKG 12-Lead   ECHOCARDIOGRAM COMPLETE   Split night study    No orders of the defined types were placed in this encounter.   Patient Instructions  Medication Instructions:  Your physician recommends that you continue on your current medications as directed. Please refer to the Current Medication list given to you today.  Testing/Procedures: Your physician has requested that you have an echocardiogram. Echocardiography is a painless test that uses sound waves to create images of your heart. It provides your doctor with information about the size and shape of your heart and how well your heart's chambers and valves are working. This procedure takes approximately one hour. There are no restrictions for this procedure.  Your physician has recommended that you have a sleep study. This test records several body functions during sleep, including: brain activity, eye movement, oxygen and carbon dioxide blood levels, heart rate and rhythm, breathing rate and rhythm, the flow of air through your mouth and nose, snoring, body muscle movements, and chest and belly movement.  ZIO XT-  Long Term Monitor Instructions   Your physician has requested you wear a ZIO patch monitor for _14_ days.  This is a single patch monitor.   [employer] supplies one patch monitor per enrollment. Additional stickers are not available. Please do not apply patch if you will be having a Nuclear Stress Test, Echocardiogram, Cardiac CT, MRI, or Chest Xray during the period you would be wearing the monitor. The patch cannot be worn during these tests. You cannot remove and re-apply the ZIO XT patch monitor.  Your ZIO patch monitor will be sent Fed Ex from [employer] directly to your home address. It may take 3-5 days to receive your monitor after you have been enrolled.  Once you have received your monitor, please review the enclosed instructions. Your monitor has already been registered assigning a specific monitor serial # to you.  Billing and Patient Assistance Program Information   We have supplied [employer] with any of your insurance information on file for billing purposes. [employer] offers a sliding scale Patient Assistance Program for patients that do not have insurance, or whose insurance does not completely cover the cost of the ZIO monitor.   You must apply for the Patient Assistance Program to qualify for this discounted rate.     To apply, please call [employer] at [phone], select option 4, then select option 2, and ask to apply for Patient Assistance Program.  Theodore Demark will ask your household income, and how many people are in your household.  They will quote your out-of-pocket cost based on that information.  [employer] will also be able to set up a 71-month interest-free payment plan if needed.  Applying the monitor   Shave hair from upper left chest.  Hold abrader disc by orange tab. Rub abrader in 40 strokes over the upper left chest as indicated in your monitor instructions.  Clean area with 4 enclosed alcohol pads. Let dry.  Apply patch as indicated in monitor instructions. Patch will  be placed under collarbone on left side of chest with arrow pointing upward.  Rub patch adhesive wings for 2 minutes. Remove white label marked "1". Remove the white label marked "2". Rub patch adhesive wings for 2 additional minutes.  While looking in a mirror, press and release button in center of patch. A small  green light will flash 3-4 times. This will be your only indicator that the monitor has been turned on. ?  Do not shower for the first 24 hours. You may shower after the first 24 hours.  Press the button if you feel a symptom. You will hear a small click. Record Date, Time and Symptom in the Patient Logbook.  When you are ready to remove the patch, follow instructions on the last 2 pages of the Patient Logbook. Stick patch monitor onto the last page of Patient Logbook.  Place Patient Logbook in the blue and white box.  Use locking tab on box and tape box closed securely.  The blue and white box has prepaid postage on it. Please place it in the mailbox as soon as possible. Your physician should have your test results approximately 7 days after the monitor has been mailed back to [facility].  Call [place] at [phone] if you have questions regarding your ZIO XT patch monitor. Call them immediately if you see an orange light blinking on your monitor.  If your monitor falls off in less than 4 days, contact our Monitor department at [phone]. ?If your monitor becomes loose or falls off after 4 days call [employer] at [phone] for suggestions on securing your monitor.?  Follow-Up: At [facility], you and your health needs are our priority.  As part of our continuing mission to provide you with exceptional heart care, we have created designated Provider Care Teams.  These Care Teams include your primary Cardiologist (physician) and Advanced Practice Providers (APPs -  Physician Assistants and Nurse Practitioners) who all work together to provide you with the care  you need, when you need it.  We recommend signing up for the patient portal called "MyChart".  Sign up information is provided on this After Visit Summary.  MyChart is used to connect with patients for Virtual Visits (Telemedicine).  Patients are able to view lab/test results, encounter notes, upcoming appointments, etc.  Non-urgent messages can be sent to your provider as well.   To learn more about what you can do with MyChart, go to [email or web address].    Your next appointment:   6 month(s)  The format for your next appointment:   In Person  Provider:   Oswaldo Milian, MD   Other Instructions Recommend Omron upper arm blood pressure cuff to monitor blood pressure at home.  Please check your blood pressure at home twice daily, write it down.  Call the office or send message via Mychart with the readings in 2 weeks for Dr. Gardiner Rhyme to review.     Signed, Donato Heinz, MD  04/16/2021 11:55 AM    [place]

## 2021-04-16 ENCOUNTER — Other Ambulatory Visit: Payer: Self-pay

## 2021-04-16 ENCOUNTER — Ambulatory Visit (INDEPENDENT_AMBULATORY_CARE_PROVIDER_SITE_OTHER): Payer: Managed Care, Other (non HMO) | Admitting: Cardiology

## 2021-04-16 ENCOUNTER — Ambulatory Visit (INDEPENDENT_AMBULATORY_CARE_PROVIDER_SITE_OTHER): Payer: Managed Care, Other (non HMO)

## 2021-04-16 VITALS — BP 140/89 | HR 61 | Ht 68.0 in | Wt 206.0 lb

## 2021-04-16 DIAGNOSIS — Z8249 Family history of ischemic heart disease and other diseases of the circulatory system: Secondary | ICD-10-CM

## 2021-04-16 DIAGNOSIS — R002 Palpitations: Secondary | ICD-10-CM | POA: Diagnosis not present

## 2021-04-16 DIAGNOSIS — R0602 Shortness of breath: Secondary | ICD-10-CM | POA: Diagnosis not present

## 2021-04-16 DIAGNOSIS — R030 Elevated blood-pressure reading, without diagnosis of hypertension: Secondary | ICD-10-CM

## 2021-04-16 DIAGNOSIS — R0683 Snoring: Secondary | ICD-10-CM

## 2021-04-16 NOTE — Patient Instructions (Signed)
Medication Instructions:  Your physician recommends that you continue on your current medications as directed. Please refer to the Current Medication list given to you today.  Testing/Procedures: Your physician has requested that you have an echocardiogram. Echocardiography is a painless test that uses sound waves to create images of your heart. It provides your doctor with information about the size and shape of your heart and how well your heart's chambers and valves are working. This procedure takes approximately one hour. There are no restrictions for this procedure.  Your physician has recommended that you have a sleep study. This test records several body functions during sleep, including: brain activity, eye movement, oxygen and carbon dioxide blood levels, heart rate and rhythm, breathing rate and rhythm, the flow of air through your mouth and nose, snoring, body muscle movements, and chest and belly movement.  ZIO XT- Long Term Monitor Instructions   Your physician has requested you wear a ZIO patch monitor for _14_ days.  This is a single patch monitor.   [employer] supplies one patch monitor per enrollment. Additional stickers are not available. Please do not apply patch if you will be having a Nuclear Stress Test, Echocardiogram, Cardiac CT, MRI, or Chest Xray during the period you would be wearing the monitor. The patch cannot be worn during these tests. You cannot remove and re-apply the ZIO XT patch monitor.  Your ZIO patch monitor will be sent Fed Ex from [employer] directly to your home address. It may take 3-5 days to receive your monitor after you have been enrolled.  Once you have received your monitor, please review the enclosed instructions. Your monitor has already been registered assigning a specific monitor serial # to you.  Billing and Patient Assistance Program Information   We have supplied [employer] with any of your insurance information on file for billing  purposes. [employer] offers a sliding scale Patient Assistance Program for patients that do not have insurance, or whose insurance does not completely cover the cost of the ZIO monitor.   You must apply for the Patient Assistance Program to qualify for this discounted rate.     To apply, please call [employer] at [phone], select option 4, then select option 2, and ask to apply for Patient Assistance Program.  Theodore Demark will ask your household income, and how many people are in your household.  They will quote your out-of-pocket cost based on that information.  [employer] will also be able to set up a 78-month interest-free payment plan if needed.  Applying the monitor   Shave hair from upper left chest.  Hold abrader disc by orange tab. Rub abrader in 40 strokes over the upper left chest as indicated in your monitor instructions.  Clean area with 4 enclosed alcohol pads. Let dry.  Apply patch as indicated in monitor instructions. Patch will be placed under collarbone on left side of chest with arrow pointing upward.  Rub patch adhesive wings for 2 minutes. Remove white label marked "1". Remove the white label marked "2". Rub patch adhesive wings for 2 additional minutes.  While looking in a mirror, press and release button in center of patch. A small green light will flash 3-4 times. This will be your only indicator that the monitor has been turned on. ?  Do not shower for the first 24 hours. You may shower after the first 24 hours.  Press the button if you feel a symptom. You will hear a small click. Record Date, Time and Symptom in  the Patient Logbook.  When you are ready to remove the patch, follow instructions on the last 2 pages of the Patient Logbook. Stick patch monitor onto the last page of Patient Logbook.  Place Patient Logbook in the blue and white box.  Use locking tab on box and tape box closed securely.  The blue and white box has prepaid postage on it. Please place it in the mailbox as soon  as possible. Your physician should have your test results approximately 7 days after the monitor has been mailed back to [facility].  Call [place] at [phone] if you have questions regarding your ZIO XT patch monitor. Call them immediately if you see an orange light blinking on your monitor.  If your monitor falls off in less than 4 days, contact our Monitor department at [phone]. ?If your monitor becomes loose or falls off after 4 days call [employer] at [phone] for suggestions on securing your monitor.?  Follow-Up: At [facility], you and your health needs are our priority.  As part of our continuing mission to provide you with exceptional heart care, we have created designated Provider Care Teams.  These Care Teams include your primary Cardiologist (physician) and Advanced Practice Providers (APPs -  Physician Assistants and Nurse Practitioners) who all work together to provide you with the care you need, when you need it.  We recommend signing up for the patient portal called "MyChart".  Sign up information is provided on this After Visit Summary.  MyChart is used to connect with patients for Virtual Visits (Telemedicine).  Patients are able to view lab/test results, encounter notes, upcoming appointments, etc.  Non-urgent messages can be sent to your provider as well.   To learn more about what you can do with MyChart, go to [email or web address].    Your next appointment:   6 month(s)  The format for your next appointment:   In Person  Provider:   Oswaldo Milian, MD   Other Instructions Recommend Omron upper arm blood pressure cuff to monitor blood pressure at home.  Please check your blood pressure at home twice daily, write it down.  Call the office or send message via Mychart with the readings in 2 weeks for Dr. Gardiner Rhyme to review.

## 2021-04-16 NOTE — Progress Notes (Unsigned)
Patient enrolled for [employer] to mail a 14 day ZIO XT to his address on file.

## 2021-04-19 DIAGNOSIS — R002 Palpitations: Secondary | ICD-10-CM | POA: Diagnosis not present

## 2021-04-23 ENCOUNTER — Telehealth: Payer: Self-pay | Admitting: *Deleted

## 2021-04-23 ENCOUNTER — Other Ambulatory Visit: Payer: Self-pay | Admitting: Cardiology

## 2021-04-23 DIAGNOSIS — R002 Palpitations: Secondary | ICD-10-CM

## 2021-04-23 DIAGNOSIS — R0683 Snoring: Secondary | ICD-10-CM

## 2021-04-23 NOTE — Telephone Encounter (Signed)
-----   Message from Silverio Lay, RN sent at 04/16/2021 11:15 AM EDT ----- Regarding: sleep study Sleep study ordered per Dr. Gardiner Rhyme  Epworth in chart  Thanks!

## 2021-04-23 NOTE — Telephone Encounter (Signed)
Patient notified of HST appointment details. °

## 2021-05-03 ENCOUNTER — Other Ambulatory Visit: Payer: Self-pay

## 2021-05-03 ENCOUNTER — Ambulatory Visit: Payer: Managed Care, Other (non HMO) | Attending: Cardiology | Admitting: Cardiovascular Disease

## 2021-05-03 DIAGNOSIS — G4737 Central sleep apnea in conditions classified elsewhere: Secondary | ICD-10-CM | POA: Insufficient documentation

## 2021-05-03 DIAGNOSIS — R0683 Snoring: Secondary | ICD-10-CM

## 2021-05-03 DIAGNOSIS — G4733 Obstructive sleep apnea (adult) (pediatric): Secondary | ICD-10-CM | POA: Diagnosis present

## 2021-05-03 DIAGNOSIS — R002 Palpitations: Secondary | ICD-10-CM

## 2021-05-08 ENCOUNTER — Ambulatory Visit (HOSPITAL_COMMUNITY): Payer: Managed Care, Other (non HMO) | Attending: Cardiology

## 2021-05-08 ENCOUNTER — Other Ambulatory Visit: Payer: Self-pay

## 2021-05-08 DIAGNOSIS — Z8616 Personal history of COVID-19: Secondary | ICD-10-CM | POA: Diagnosis not present

## 2021-05-08 DIAGNOSIS — U099 Post covid-19 condition, unspecified: Secondary | ICD-10-CM

## 2021-05-08 DIAGNOSIS — R002 Palpitations: Secondary | ICD-10-CM | POA: Insufficient documentation

## 2021-05-08 DIAGNOSIS — R42 Dizziness and giddiness: Secondary | ICD-10-CM | POA: Insufficient documentation

## 2021-05-08 DIAGNOSIS — R0602 Shortness of breath: Secondary | ICD-10-CM | POA: Diagnosis not present

## 2021-05-08 DIAGNOSIS — R0789 Other chest pain: Secondary | ICD-10-CM | POA: Diagnosis not present

## 2021-05-08 DIAGNOSIS — R079 Chest pain, unspecified: Secondary | ICD-10-CM | POA: Diagnosis not present

## 2021-05-08 DIAGNOSIS — R011 Cardiac murmur, unspecified: Secondary | ICD-10-CM | POA: Diagnosis not present

## 2021-05-08 LAB — ECHOCARDIOGRAM COMPLETE
Area-P 1/2: 4.27 cm2
S' Lateral: 2.4 cm

## 2021-05-18 ENCOUNTER — Encounter: Payer: Self-pay | Admitting: Cardiovascular Disease

## 2021-05-18 NOTE — Procedures (Signed)
                                [facility]         Patient Name: Joel Berry, Joel Berry Date: 05/03/2021 Gender: Male D.O.B: 1980/12/25 Age (years): 40 Referring Provider: Oswaldo Milian Height (inches): 82 Interpreting Physician: Shelva Majestic MD, ABSM Weight (lbs): 206 RPSGT: Peak, Robert BMI: 31 MRN: [id number] Neck Size: <br>  CLINICAL INFORMATION Sleep Study Type: HST  Indication for sleep study: Snoring, daytime sleepiness  Epworth Sleepiness Score: 14  SLEEP STUDY TECHNIQUE A multi-channel overnight portable sleep study was performed. The channels recorded were: nasal airflow, thoracic respiratory movement, and oxygen saturation with a pulse oximetry. Snoring was also monitored.  MEDICATIONS azelastine (ASTELIN) 0.1 % nasal spray omeprazole (PRILOSEC) 40 MG  Patient self administered medications include: N/A.  SLEEP ARCHITECTURE Patient was studied for 476.4 minutes. The sleep efficiency was 79.4 % and the patient was supine for 8.8%. The arousal index was 0.0 per hour.  RESPIRATORY PARAMETERS The overall AHI was 9.3 per hour, with a central apnea index of 6.8 per hour.  The oxygen nadir was 89% during sleep.  CARDIAC DATA Mean heart rate during sleep was 58.2 bpm.  IMPRESSIONS - Mild obstructive sleep apnea overall (AHI 9.3/h).  There is a significant positional component with supine sleep AHI 35.8/h versus non-supine sleep AHI 6.8/h. The severity of sleep apnea during REM sleep cannot be assessed on this home study. - Mild central sleep apnea occurred during this study (CAI = 6.8/h). - Mild oxygen desaturation was noted during this study (Min O2 = 89%). - Patient snored for 147.2 minutes ( 30.9% of sleep).  DIAGNOSIS - Obstructive Sleep Apnea (G47.33) - Central Sleep Apnea (G47.37)  RECOMMENDATIONS - CPAP titration to determine optimal pressure required to alleviate sleep disordered breathing. BiPAP or ASV titration may be required to eliminate  central sleep apnea. - Effort should be made to optimie nasal and oropharyngeal patency. - Positional therapy avoiding supine position during sleep. - Avoid alcohol, sedatives and other CNS depressants that may worsen sleep apnea and disrupt normal sleep architecture. - Sleep hygiene should be reviewed to assess factors that may improve sleep quality. - Weight management (BMI31) and regular exercise should be initiated or continued. - Recommend a download and sleep clinic evaluation after one month of therapy.   [Electronically signed] 05/18/2021 04:02 PM  Shelva Majestic MD, [facility], [place], American Board of Sleep Medicine   NPI: [id number] [place] PH: [phone]   FX: [phone] [employer] [facility]

## 2021-05-21 ENCOUNTER — Telehealth: Payer: Self-pay | Admitting: *Deleted

## 2021-05-21 ENCOUNTER — Other Ambulatory Visit: Payer: Self-pay | Admitting: Cardiovascular Disease

## 2021-05-21 DIAGNOSIS — G4733 Obstructive sleep apnea (adult) (pediatric): Secondary | ICD-10-CM

## 2021-05-21 DIAGNOSIS — G4737 Central sleep apnea in conditions classified elsewhere: Secondary | ICD-10-CM

## 2021-05-21 NOTE — Telephone Encounter (Signed)
Left message to return a call to discuss sleep study results and recommendations. 

## 2021-05-22 ENCOUNTER — Other Ambulatory Visit: Payer: Self-pay | Admitting: Cardiovascular Disease

## 2021-05-22 DIAGNOSIS — G4733 Obstructive sleep apnea (adult) (pediatric): Secondary | ICD-10-CM

## 2021-05-22 DIAGNOSIS — G4731 Primary central sleep apnea: Secondary | ICD-10-CM

## 2021-05-22 NOTE — Telephone Encounter (Signed)
Ptient returned a call to me and was given his HST results and recommendations. Patient informed me that he had appointment with a ENT last week and was told that his nasal passages are narrow. The ENT plans to correct this as well as "rotor rooter" his sinuses out. I suggested that he comes by the office to get a copy of the sleep study and take it to the next ENT appointment. The results may help him in the way he needs to treat the patient. Patient agrees and will come by to get a copy. He also agrees to proceed with CPAP titration. Dr Claiborne Billings will be notified of our conversation.

## 2021-05-22 NOTE — Telephone Encounter (Signed)
-----   Message from Troy Sine, MD sent at 05/18/2021  4:06 PM EDT ----- Mariann Laster, please notify patient and schedule for an in-lab CPAP titration.

## 2021-05-29 ENCOUNTER — Telehealth: Payer: Self-pay | Admitting: *Deleted

## 2021-05-29 NOTE — Telephone Encounter (Signed)
-----   Message from Lauralee Evener, [place] sent at 05/22/2021 12:47 PM EDT ----- CPAP titration

## 2021-05-29 NOTE — Telephone Encounter (Signed)
Patient notified of CPAP titration appointment scheduled at [facility] on 06/05/21.

## 2021-06-05 ENCOUNTER — Other Ambulatory Visit: Payer: Self-pay

## 2021-06-05 ENCOUNTER — Ambulatory Visit: Payer: Managed Care, Other (non HMO) | Attending: Cardiovascular Disease | Admitting: Cardiovascular Disease

## 2021-06-05 DIAGNOSIS — G4719 Other hypersomnia: Secondary | ICD-10-CM

## 2021-06-05 DIAGNOSIS — G4731 Primary central sleep apnea: Secondary | ICD-10-CM | POA: Diagnosis not present

## 2021-06-05 DIAGNOSIS — G4733 Obstructive sleep apnea (adult) (pediatric): Secondary | ICD-10-CM | POA: Insufficient documentation

## 2021-06-20 NOTE — Procedures (Signed)
[facility]        Patient Name: Joel Berry, Sao Date: 06/05/2021 Gender: Male D.O.B: June 11, 1981 Age (years): 40 Referring Provider: Oswaldo Milian Height (inches): 68 Interpreting Physician: Shelva Majestic MD, ABSM Weight (lbs): 206 RPSGT: Rosebud Poles BMI: 31 MRN: [id number] Neck Size: 16.50  CLINICAL INFORMATION The patient is referred for a CPAP titration to treat sleep apnea.  Epworth Sleepiness Score: 14  Date of NPSG: 05/03/2021: AHI 9.3/h; CAI 6.8/h: supine AHI 35.8/h; O2 nadir 89%.  SLEEP STUDY TECHNIQUE As per the AASM Manual for the Scoring of Sleep and Associated Events v2.3 (April 2016) with a hypopnea requiring 4% desaturations.  The channels recorded and monitored were frontal, central and occipital EEG, electrooculogram (EOG), submentalis EMG (chin), nasal and oral airflow, thoracic and abdominal wall motion, anterior tibialis EMG, snore microphone, electrocardiogram, and pulse oximetry. Continuous positive airway pressure (CPAP) was initiated at the beginning of the study and titrated to treat sleep-disordered breathing.  MEDICATIONS azelastine (ASTELIN) 0.1 % nasal spray omeprazole (PRILOSEC) 40 MG capsule (Expired)  Medications self-administered by patient taken the night of the study : N/A  TECHNICIAN COMMENTS Comments added by technician: CPAP therapy started at 4 cm of H20 and increased to 9 cm of H2O with an EPR of 2. Central-like events noticed through out study, especially during transition from stage wake to sleep. Patient tolerated CPAP very well. Optimal pressure of obtained due to REM- supine stages observed. Increased EEG arousals observed, associated with chin EMG arousals. Questionable Bruxism or GERD ???? Comments added by scorer: N/A  RESPIRATORY PARAMETERS Optimal PAP Pressure (cm): 9 AHI at Optimal Pressure (/hr): 4.2 Overall Minimal O2 (%): 88.00 Supine % at Optimal Pressure  (%): 44 Minimal O2 at Optimal Pressure (%): 92.0   SLEEP ARCHITECTURE The study was initiated at 10:13:29 PM and ended at 6:30:59 AM.  Sleep onset time was 15.7 minutes and the sleep efficiency was 86.2%. The total sleep time was 429 minutes.  The patient spent 4.20% of the night in stage N1 sleep, 48.02% in stage N2 sleep, 23.43% in stage N3 and 24.4% in REM.Stage REM latency was 117.0 minutes  Wake after sleep onset was 52.8. Alpha intrusion was absent. Supine sleep was 20.16%.  CARDIAC DATA The 2 lead EKG demonstrated sinus rhythm. The mean heart rate was 63.89 beats per minute. Other EKG findings include: None.  LEG MOVEMENT DATA The total Periodic Limb Movements of Sleep (PLMS) were 0. The PLMS index was 0.00. A PLMS index of <15 is considered normal in adults.  IMPRESSIONS - CPAP was initiated at 5 and was titrated to 9 cm of water. AHI at 9 cm was 3.4/h. However, central apnea was present at all pressures, with the least being at 9 cm. O2 nadir at 9 cm was 92%. - Mild Central Sleep Apnea was noted during this titration (CAI 9.8/h). - Mild oxygen desaturations were observed during this titration to a nadir of 88%. - The patient snored with moderate snoring volume during this titration study. - No cardiac abnormalities were observed during this study. - Clinically significant periodic limb movements were not noted during this study. Arousals associated with PLMs were rare  DIAGNOSIS - Obstructive Sleep Apnea (G47.33)  RECOMMENDATIONS - Due to central events throughout the  diagnostic and titration studies recommend an expeditious BIPAP with possible ASV study for optimal evaluation and treatment of his sleep disordered breathing. If unable to schedule, consider initiation of BiPAP Auto with EPAP min of 5, PS of 4 and IPAP max of 16 cm of water with heated humidification.. A Fisher&Paykel Full Face Mask F&P Vitera (new) mask was used for the titration. - Effort should be made to  optimize nasal and oropharyngeal patency - Avoid alcohol, sedatives and other CNS depressants that may worsen sleep apnea and disrupt normal sleep architecture. - Sleep hygiene should be reviewed to assess factors that may improve sleep quality. - Weight management and regular exercise should be initiated or continued. - Recommend a download and sleep clinic evaluation after 4 weeks of therapy   [Electronically signed] 06/28/2021 09:41 AM  Shelva Majestic MD, [facility], ABSM Diplomate, American Board of Sleep Medicine   NPI: [id number] [place] PH: [phone]   FX: [phone] [place]

## 2021-06-28 ENCOUNTER — Encounter: Payer: Self-pay | Admitting: Cardiovascular Disease

## 2021-07-10 ENCOUNTER — Telehealth: Payer: Self-pay | Admitting: *Deleted

## 2021-07-10 NOTE — Telephone Encounter (Signed)
Prior Authorization for BIPAP/?ASV titration  sent to [facility] via web portal. Tracking Number [id number].

## 2021-08-03 ENCOUNTER — Other Ambulatory Visit: Payer: Self-pay | Admitting: *Deleted

## 2021-08-03 DIAGNOSIS — G4733 Obstructive sleep apnea (adult) (pediatric): Secondary | ICD-10-CM

## 2021-08-03 NOTE — Telephone Encounter (Signed)
I placed a call to [employer] to check auth status. Per Representative BIPAP approved. Auth # [id number]. Valid dates 07/03/21 to 03/01/22. Appointment scheduled and patient notified.

## 2021-09-10 ENCOUNTER — Other Ambulatory Visit: Payer: Self-pay

## 2021-09-10 ENCOUNTER — Ambulatory Visit: Payer: Managed Care, Other (non HMO) | Attending: Cardiovascular Disease | Admitting: Cardiovascular Disease

## 2021-09-10 DIAGNOSIS — G4731 Primary central sleep apnea: Secondary | ICD-10-CM

## 2021-09-10 DIAGNOSIS — G4733 Obstructive sleep apnea (adult) (pediatric): Secondary | ICD-10-CM

## 2021-10-01 ENCOUNTER — Encounter: Payer: Self-pay | Admitting: Cardiovascular Disease

## 2021-10-01 NOTE — Procedures (Signed)
[facility]          Patient Name: Joel Berry, Joel Berry Date: 09/10/2021 Gender: Male D.O.B: [birth year] Age (years): 40 Referring Provider: Oswaldo Milian Height (inches): 68 Interpreting Physician: Shelva Majestic MD, ABSM Weight (lbs): 196 RPSGT: Rosebud Poles BMI: 30 MRN: [id number] Neck Size: 16.50  CLINICAL INFORMATION The patient is referred for BIPAP and possible adaptive servo-ventilator titration (ASV) study.  MEDICATIONS elastine (ASTELIN) 0.1 % nasal spray omeprazole (PRILOSEC) 40 MG capsule (Expired) Medications self-administered by patient taken the night of the study : N/A  SLEEP STUDY TECHNIQUE As per the AASM Manual for the Scoring of Sleep and Associated Events v2.3 (April 2016) with a hypopnea requiring 4% desaturations.  The channels recorded and monitored were frontal, central and occipital EEG, electrooculogram (EOG), submentalis EMG (chin), nasal and oral airflow, thoracic and abdominal wall motion, anterior tibialis EMG, snore microphone, electrocardiogram, and pulse oximetry.  RESPIRATORY PARAMETERS Optimal Min IPAP (cm): 8 Optimal Max IPAP (cm): 18 Optimal Min EPAP (cm): 8 Optimal Max EPAP (cm): 18 Optimal Max Pressure (cm): 16 Optimal Min PS (cm): 4 Optimal Max PS (cm): 16 Opitmal Breathing Rate (/min): Auto Overall Min O2 (%): 77.00 Min O2 at Optimal Pressure (%): 77.00 AHI at Optimal (/hr): N/A   SLEEP ARCHITECTURE During a recording time of 481.5 minutes, the patient slept for 426 minutes. Sleep efficiency was 88.5%. The patient spent 2.70% of the night in stage N1 sleep, 52.46% in stage N2 sleep, 26.06% in stage N3 and 18.8% in REM. Wake after sleep onset (WASO) was 43.5 minutes. Alpha intrusion was  PRESENT ABSENT . Supine sleep was 10.11%. The arousal index was 8.2.  LEG MOVEMENT DATA PLM Index (/hr): 0.00 PLM Arousal Index (/hr): 0.0  CARDIAC DATA The 2 lead EKG demonstrated sinus rhythm. The mean heart  rate was 55.19 beats per minute. Other EKG findings include: None.  IMPRESSIONS - BiPAP was initiated at 84/4 and was titrated to 15/11. Due to continued central events ASV was initiated at 04/06/15. Moderete obstructive sleep apnea occurred during this study (AHI  15.6/hour). ASV was initiated with resolution of central events. An optimal ASV pressure was selected. - Mild central sleep apnea occurred during this study (CAI 11). - Severe oxygen desaturation to a nadir of 77% at 7/4 cm of water. - The patient snored for % of sleep with soft snoring volume. - No cardiac abnormalities were noted during this study. - Clinically significant periodic limb movements did not occur during sleep.  DIAGNOSIS - Obstructive Sleep Apnea (G47.33)  RECOMMENDATIONS - Trial of SV Advance EPAP Min 8 and Max 18 cmH2O, Pressure Support Min 4 and Max 16 cmH2O with Max Pressure of 20 cmH2O and Breath Rate of Auto BrPM. - Effort should be made to optimize nasal and oropharyngeal patency. - Avoid alcohol, sedatives and other CNS depressants that may worsen sleep apnea and disrupt normal sleep architecture. - Sleep hygiene should be reviewed to assess factors that may improve sleep quality. - Weight management and regular exercise should be initiated or continued. - Recommend a download and sleep clinic evaluation after 4 weeks of therapy.  [Electronically signed] 10/01/2021 10:09 AM  Shelva Majestic MD, [facility] Diplomate, American Board of Sleep Medicine   NPI: [id number]  [place] PH: [phone]   FX: [phone] ACCREDITED BY  THE AMERICAN ACADEMY OF SLEEP MEDICINE

## 2021-10-02 ENCOUNTER — Telehealth: Payer: Self-pay | Admitting: *Deleted

## 2021-10-02 NOTE — Telephone Encounter (Signed)
Received a call from [facility] with [facility] [employer] notifying me that she has forwarded the ASV order to [facility] due to the patient's insurance.

## 2021-10-22 NOTE — Progress Notes (Signed)
Cardiology Office Note:    Date:  10/25/2021   ID:  Joel Berry 10-18-1980, MRN [id number]  PCP:  Leslie Andrea, MD  Cardiologist:  Donato Heinz, MD  Electrophysiologist:  None   Referring MD: Leslie Andrea, MD   Chief Complaint  Patient presents with   Palpitations     History of Present Illness:    Joel Berry is a 41 y.o. male with a hx of GERD, allergic rhinitis who presents for follow-up.  He was referred by Carlis Abbott, NP for evaluation of palpitations, initially seen on 04/16/2021.  He reports that he has had COVID-19 twice, once in 20020 and again in 2022.  Following episode in 2022, he has had issues with shortness of breath.  Can occur at rest or with exertion.  He walks on the treadmill for 30 minutes/day but notices he is more limited due to shortness of breath recently.  He denies any chest pain.  Also reports has been having palpitations.  Describes feeling fluttering in chest that can last anywhere from a couple minutes to over 10 minutes.  Was happening daily but not in last 2 days.  Reports occasional lightheadedness, denies any syncope.  No lower extremity edema.  He drinks 1 thermos of coffee per day.  Rare alcohol use.  Does not notice correlation of palpitations with caffeine or alcohol.  No smoking history.  Family history includes maternal aunt died at age 45, found to have hypertrophic cardiomyopathy.  Echocardiogram 05/08/2021 showed normal biventricular function, grade 1 diastolic dysfunction, no significant valvular disease.  Zio patch x14 days on 05/11/2021 showed 1 episode of NSVT lasting 8 beats.  Since last clinic visit, he reports that he is doing well.  Reports palpitations have resolved.  He denies any chest pain, lightheadedness, syncope, or lower extremity edema.  He still has shortness of breath but reports has improved.  He exercises 5 days/week for about 30 to 60 minutes, lifts weights and does cardio.  Wt Readings from Last  3 Encounters:  10/25/21 199 lb 3.2 oz (90.4 kg)  04/16/21 206 lb (93.4 kg)  11/22/20 202 lb 9.6 oz (91.9 kg)    BP Readings from Last 3 Encounters:  10/25/21 100/62  04/16/21 140/89  11/22/20 122/70      Past Medical History:  Diagnosis Date   Allergic rhinitis    Chronic anal fissure    Dyspnea    08-08-2020  per pt has had sob since having  pneumonia 07/ 2020   Family history of adverse reaction to anesthesia    Daughter @ age 80--- ran fever with tonsils out, nausa/ vomiting, pt stated happened several hours after procedure   GERD (gastroesophageal reflux disease)    History of cardiac murmur as a child    History of chest pain 03/26/2020   ED visit in epic dx atypical chest pain/ sob,  was given prednisone and albuterol inhaler as needed, ruled noncardiac  (08-08-2020  pt denies any chest pain since)   History of pneumonia 03/2019   per pt told was probable covid related even though tested negative   Pneumonia     Past Surgical History:  Procedure Laterality Date   BIOPSY  06/19/2020   Procedure: BIOPSY;  Surgeon: Eloise Harman, DO;  [facility]: [facility];  Service: Endoscopy;;   COLONOSCOPY Bilateral    06/19/2020   COLONOSCOPY WITH PROPOFOL N/A 06/19/2020   Procedure: COLONOSCOPY WITH PROPOFOL;  Surgeon: Eloise Harman, DO;  [facility]:  [facility];  Service: Endoscopy;  Laterality: N/A;  1:00pm   ESOPHAGOGASTRODUODENOSCOPY (EGD) WITH PROPOFOL N/A 06/19/2020   Procedure: ESOPHAGOGASTRODUODENOSCOPY (EGD) WITH PROPOFOL;  Surgeon: Eloise Harman, DO;  Location: [facility];  Service: Endoscopy;  Laterality: N/A;   POLYPECTOMY  06/19/2020   Procedure: POLYPECTOMY;  Surgeon: Eloise Harman, DO;  [facility]: [facility];  Service: Endoscopy;;   SPHINCTEROTOMY N/A 08/15/2020   Procedure: CHEMICAL SPHINCTEROTOMY, FISSURECTOMY;  Surgeon: Leighton Ruff, MD;  Location: [facility];  Service: General;  Laterality: N/A;   TONSILLECTOMY AND  ADENOIDECTOMY  12/2019    Current Medications: Current Meds  Medication Sig   omeprazole (PRILOSEC) 40 MG capsule Take 1 capsule (40 mg total) by mouth in the morning and at bedtime.     Allergies:   Patient has no known allergies.   Social History   Socioeconomic History   Marital status: Married    Spouse name: Not on file   Number of children: Not on file   Years of education: Not on file   Highest education level: Not on file  Occupational History   Not on file  Tobacco Use   Smoking status: Never   Smokeless tobacco: Never  Vaping Use   Vaping Use: Never used  Substance and Sexual Activity   Alcohol use: Yes    Comment: occasional   Drug use: Never   Sexual activity: Not on file  Other Topics Concern   Not on file  Social History Narrative   Not on file   Social Determinants of Health   Financial Resource Strain: Not on file  Food Insecurity: Not on file  Transportation Needs: Not on file  Physical Activity: Not on file  Stress: Not on file  Social Connections: Not on file     Family History: The patient's family history includes Cancer in an other family member; Colon cancer in his maternal grandmother and mother; Hypertension in his father.  ROS:   Please see the history of present illness.     All other systems reviewed and are negative.  EKGs/Labs/Other Studies Reviewed:    The following studies were reviewed today:   EKG:   10/25/21: NSR, rate 59, no ST abnormalities  Recent Labs: No results found for requested labs within last 8760 hours.  Recent Lipid Panel No results found for: CHOL, TRIG, HDL, CHOLHDL, VLDL, LDLCALC, LDLDIRECT  Physical Exam:    VS:  BP 100/62    Pulse (!) 59    Ht 5\' 8"  (1.727 m)    Wt 199 lb 3.2 oz (90.4 kg)    SpO2 98%    BMI 30.29 kg/m     Wt Readings from Last 3 Encounters:  10/25/21 199 lb 3.2 oz (90.4 kg)  04/16/21 206 lb (93.4 kg)  11/22/20 202 lb 9.6 oz (91.9 kg)     GEN:  Well nourished, well  developed in no acute distress HEENT: Normal NECK: No JVD; No carotid bruits CARDIAC: RRR, no murmurs, rubs, gallops RESPIRATORY:  Clear to auscultation without rales, wheezing or rhonchi  ABDOMEN: Soft, non-tender, non-distended MUSCULOSKELETAL:  No edema; No deformity  SKIN: Warm and dry NEUROLOGIC:  Alert and oriented x 3 PSYCHIATRIC:  Normal affect   ASSESSMENT:    1. Palpitations   2. Shortness of breath   3. Family history of hypertrophic cardiomyopathy   4. OSA (obstructive sleep apnea)     PLAN:    Palpitations: Echocardiogram 05/08/2021 showed normal biventricular function, grade  1 diastolic dysfunction, no significant valvular disease.  Zio patch x14 days on 05/11/2021 showed 1 episode of NSVT lasting 8 beats.  Dyspnea: Has occurred since COVID-19 infection.  No structural heart disease on echocardiogram as above  Family history of hypertrophic cardiomyopathy: No evidence of HCM on echocardiogram as above  Elevated BP: BP elevated i at initial clinic visit 04/2021, no history of hypertension.  BP has normalized, 100/62 in clinic today  OSA: Positive sleep study 05/2021, starting on BiPAP  RTC in 1 year  Medication Adjustments/Labs and Tests Ordered: Current medicines are reviewed at length with the patient today.  Concerns regarding medicines are outlined above.  Orders Placed This Encounter  Procedures   EKG 12-Lead    No orders of the defined types were placed in this encounter.    Patient Instructions  Medication Instructions:  No changes *If you need a refill on your cardiac medications before your next appointment, please call your pharmacy*   Lab Work: None ordered If you have labs (blood work) drawn today and your tests are completely normal, you will receive your results only by: [place] (if you have MyChart) OR A paper copy in the mail If you have any lab test that is abnormal or we need to change your treatment, we will call you to review  the results.   Testing/Procedures: None ordered   Follow-Up: At [facility], you and your health needs are our priority.  As part of our continuing mission to provide you with exceptional heart care, we have created designated Provider Care Teams.  These Care Teams include your primary Cardiologist (physician) and Advanced Practice Providers (APPs -  Physician Assistants and Nurse Practitioners) who all work together to provide you with the care you need, when you need it.  We recommend signing up for the patient portal called "MyChart".  Sign up information is provided on this After Visit Summary.  MyChart is used to connect with patients for Virtual Visits (Telemedicine).  Patients are able to view lab/test results, encounter notes, upcoming appointments, etc.  Non-urgent messages can be sent to your provider as well.   To learn more about what you can do with MyChart, go to [email or web address].    Your next appointment:   12 month(s)  The format for your next appointment:   In Person  Provider:   Dr. Gardiner Rhyme   Signed, Donato Heinz, MD  10/25/2021 1:41 PM    [place]

## 2021-10-25 ENCOUNTER — Encounter: Payer: Self-pay | Admitting: Cardiology

## 2021-10-25 ENCOUNTER — Ambulatory Visit (INDEPENDENT_AMBULATORY_CARE_PROVIDER_SITE_OTHER): Payer: Managed Care, Other (non HMO) | Admitting: Cardiology

## 2021-10-25 ENCOUNTER — Other Ambulatory Visit: Payer: Self-pay

## 2021-10-25 VITALS — BP 100/62 | HR 59 | Ht 68.0 in | Wt 199.2 lb

## 2021-10-25 DIAGNOSIS — G4733 Obstructive sleep apnea (adult) (pediatric): Secondary | ICD-10-CM

## 2021-10-25 DIAGNOSIS — R0602 Shortness of breath: Secondary | ICD-10-CM

## 2021-10-25 DIAGNOSIS — R002 Palpitations: Secondary | ICD-10-CM

## 2021-10-25 DIAGNOSIS — Z8249 Family history of ischemic heart disease and other diseases of the circulatory system: Secondary | ICD-10-CM

## 2021-10-25 NOTE — Patient Instructions (Signed)
Medication Instructions:  No changes *If you need a refill on your cardiac medications before your next appointment, please call your pharmacy*   Lab Work: None ordered If you have labs (blood work) drawn today and your tests are completely normal, you will receive your results only by: [place] (if you have MyChart) OR A paper copy in the mail If you have any lab test that is abnormal or we need to change your treatment, we will call you to review the results.   Testing/Procedures: None ordered   Follow-Up: At [facility], you and your health needs are our priority.  As part of our continuing mission to provide you with exceptional heart care, we have created designated Provider Care Teams.  These Care Teams include your primary Cardiologist (physician) and Advanced Practice Providers (APPs -  Physician Assistants and Nurse Practitioners) who all work together to provide you with the care you need, when you need it.  We recommend signing up for the patient portal called "MyChart".  Sign up information is provided on this After Visit Summary.  MyChart is used to connect with patients for Virtual Visits (Telemedicine).  Patients are able to view lab/test results, encounter notes, upcoming appointments, etc.  Non-urgent messages can be sent to your provider as well.   To learn more about what you can do with MyChart, go to [email or web address].    Your next appointment:   12 month(s)  The format for your next appointment:   In Person  Provider:   Dr. Gardiner Rhyme

## 2022-03-07 ENCOUNTER — Telehealth: Payer: Self-pay | Admitting: *Deleted

## 2022-03-07 NOTE — Telephone Encounter (Signed)
Received a fax from [facility] the patient turned in his BIPAP sleep device without MD authorization. Ordering physician will be notified.

## 2023-05-12 ENCOUNTER — Ambulatory Visit
Admission: EM | Admit: 2023-05-12 | Discharge: 2023-05-12 | Disposition: A | Discharge status: Home / Self Care | Payer: Managed Care, Other (non HMO) | Attending: Nurse Practitioner | Admitting: Nurse Practitioner

## 2023-05-12 DIAGNOSIS — J019 Acute sinusitis, unspecified: Secondary | ICD-10-CM

## 2023-05-12 MED ORDER — FLUTICASONE PROPIONATE 50 MCG/ACT NA SUSP: 2.0000 | Freq: Every day | NASAL | 0 refills | Status: AC

## 2023-05-12 MED ORDER — AMOXICILLIN-POT CLAVULANATE 875-125 MG PO TABS: 1.0000 | ORAL_TABLET | Freq: Two times a day (BID) | ORAL | 0 refills | Status: AC

## 2023-05-12 MED ORDER — PSEUDOEPH-BROMPHEN-DM 30-2-10 MG/5ML PO SYRP: 5.0000 mL | ORAL_SOLUTION | Freq: Four times a day (QID) | ORAL | 0 refills | Status: AC | PRN

## 2023-05-12 NOTE — ED Triage Notes (Addendum)
Pt c/o of facial pain, cough and congestion, body aches x  1 week, started las Tuesday and has progressively gotten worse. Negative Home COVID test yesterday

## 2023-05-12 NOTE — ED Provider Notes (Signed)
[facility]    CSN: [id number] Arrival date & time: 05/12/23  0836      History   Chief Complaint No chief complaint on file.   HPI Joel Berry is a 42 y.o. male.   The history is provided by the patient.   Patient presents for complaints of facial pain, tooth pain, cough, nasal congestions, and bodyaches have been present for the past week.  Patient denies fever, chills, headache, ear pain, wheezing, shortness of breath, difficulty breathing, chest pain, abdominal pain, nausea, vomiting, or diarrhea.  Patient states he has been using several over-the-counter cough and cold medications with minimal relief.  States he took a home COVID test on yesterday which was negative.  Past Medical History:  Diagnosis Date   Allergic rhinitis    Chronic anal fissure    Dyspnea    08-08-2020  per pt has had sob since having  pneumonia 07/ 2020   Family history of adverse reaction to anesthesia    Daughter @ age 61--- ran fever with tonsils out, nausa/ vomiting, pt stated happened several hours after procedure   GERD (gastroesophageal reflux disease)    History of cardiac murmur as a child    History of chest pain 03/26/2020   ED visit in epic dx atypical chest pain/ sob,  was given prednisone and albuterol inhaler as needed, ruled noncardiac  (08-08-2020  pt denies any chest pain since)   History of pneumonia 03/2019   per pt told was probable covid related even though tested negative   Pneumonia     Patient Active Problem List   Diagnosis Date Noted   Eosinophilic esophagitis 08/09/2020   GERD (gastroesophageal reflux disease) 05/24/2020   Hoarseness of voice 05/24/2020   Fissure in ano 05/16/2020   Grade II hemorrhoids 05/16/2020   Rectal bleeding 05/16/2020    Past Surgical History:  Procedure Laterality Date   BIOPSY  06/19/2020   Procedure: BIOPSY;  Surgeon: Lanelle Bal, DO;  Location: [facility];  Service: Endoscopy;;   COLONOSCOPY Bilateral     06/19/2020   COLONOSCOPY WITH PROPOFOL N/A 06/19/2020   Procedure: COLONOSCOPY WITH PROPOFOL;  Surgeon: Lanelle Bal, DO;  Location: [facility];  Service: Endoscopy;  Laterality: N/A;  1:00pm   ESOPHAGOGASTRODUODENOSCOPY (EGD) WITH PROPOFOL N/A 06/19/2020   Procedure: ESOPHAGOGASTRODUODENOSCOPY (EGD) WITH PROPOFOL;  Surgeon: Lanelle Bal, DO;  Location: [facility];  Service: Endoscopy;  Laterality: N/A;   POLYPECTOMY  06/19/2020   Procedure: POLYPECTOMY;  Surgeon: Lanelle Bal, DO;  Location: [facility];  Service: Endoscopy;;   SPHINCTEROTOMY N/A 08/15/2020   Procedure: CHEMICAL SPHINCTEROTOMY, FISSURECTOMY;  Surgeon: Romie Levee, MD;  Location: [facility];  Service: General;  Laterality: N/A;   TONSILLECTOMY AND ADENOIDECTOMY  12/2019       Home Medications    Prior to Admission medications   Medication Sig Start Date End Date Taking? Authorizing Provider  amoxicillin-clavulanate (AUGMENTIN) 875-125 MG tablet Take 1 tablet by mouth every 12 (twelve) hours. 05/12/23  Yes Tunisha Ruland-Warren, Sadie Haber, NP  brompheniramine-pseudoephedrine-DM 30-2-10 MG/5ML syrup Take 5 mLs by mouth 4 (four) times daily as needed. 05/12/23  Yes Ozzie Knobel-Warren, Sadie Haber, NP  fluticasone (FLONASE) 50 MCG/ACT nasal spray Place 2 sprays into both nostrils daily. 05/12/23  Yes Natallie Ravenscroft-Warren, Sadie Haber, NP  azelastine (ASTELIN) 0.1 % nasal spray Use 1-2 sprays each nostril once a day as needed for stuffy nose 09/13/20   Nehemiah Settle, FNP  omeprazole (  PRILOSEC) 40 MG capsule Take 1 capsule (40 mg total) by mouth in the morning and at bedtime. 06/19/20 10/25/21  Lanelle Bal, DO    Family History Family History  Problem Relation Age of Onset   Cancer Other    Colon cancer Mother    Colon cancer Maternal Grandmother    Hypertension Father     Social History Social History   Tobacco Use   Smoking status: Never   Smokeless tobacco: Never  Vaping Use   Vaping status:  Never Used  Substance Use Topics   Alcohol use: Yes    Comment: occasional   Drug use: Never     Allergies   Patient has no known allergies.   Review of Systems Review of Systems Per HPI  Physical Exam Triage Vital Signs ED Triage Vitals  Encounter Vitals Group     BP 05/12/23 1002 (!) 136/94     Systolic BP Percentile --      Diastolic BP Percentile --      Pulse Rate 05/12/23 1002 96     Resp 05/12/23 1002 13     Temp 05/12/23 1002 98.2 F (36.8 C)     Temp Source 05/12/23 1002 Oral     SpO2 05/12/23 1002 97 %     Weight --      Height --      Head Circumference --      Peak Flow --      Pain Score 05/12/23 1003 6     Pain Loc --      Pain Education --      Exclude from Growth Chart --    No data found.  Updated Vital Signs BP (!) 136/94 (BP Location: Right Arm)   Pulse 96   Temp 98.2 F (36.8 C) (Oral)   Resp 13   SpO2 97%   Visual Acuity Right Eye Distance:   Left Eye Distance:   Bilateral Distance:    Right Eye Near:   Left Eye Near:    Bilateral Near:     Physical Exam Vitals and nursing note reviewed.  Constitutional:      General: He is not in acute distress.    Appearance: Normal appearance.  HENT:     Head: Normocephalic.     Right Ear: Tympanic membrane, ear canal and external ear normal.     Left Ear: Tympanic membrane, ear canal and external ear normal.     Nose: Congestion present.     Right Turbinates: Enlarged and swollen.     Left Turbinates: Enlarged and swollen.     Right Sinus: No maxillary sinus tenderness or frontal sinus tenderness.     Left Sinus: No maxillary sinus tenderness or frontal sinus tenderness.     Mouth/Throat:     Lips: Pink.     Mouth: Mucous membranes are moist.     Pharynx: Uvula midline. Posterior oropharyngeal erythema and postnasal drip present. No pharyngeal swelling, oropharyngeal exudate or uvula swelling.  Eyes:     Extraocular Movements: Extraocular movements intact.     Conjunctiva/sclera:  Conjunctivae normal.     Pupils: Pupils are equal, round, and reactive to light.  Cardiovascular:     Rate and Rhythm: Normal rate and regular rhythm.     Pulses: Normal pulses.     Heart sounds: Normal heart sounds.  Pulmonary:     Effort: Pulmonary effort is normal. No respiratory distress.     Breath sounds: Normal breath sounds. No  stridor. No wheezing, rhonchi or rales.  Abdominal:     General: Bowel sounds are normal.     Palpations: Abdomen is soft.     Tenderness: There is no abdominal tenderness.  Musculoskeletal:     Cervical back: Normal range of motion.  Lymphadenopathy:     Cervical: No cervical adenopathy.  Skin:    General: Skin is warm and dry.  Neurological:     General: No focal deficit present.     Mental Status: He is alert and oriented to person, place, and time.  Psychiatric:        Mood and Affect: Mood normal.        Behavior: Behavior normal.      UC Treatments / Results  Labs (all labs ordered are listed, but only abnormal results are displayed) Labs Reviewed - No data to display  EKG   Radiology No results found.  Procedures Procedures (including critical care time)  Medications Ordered in UC Medications - No data to display  Initial Impression / Assessment and Plan / UC Course  I have reviewed the triage vital signs and the nursing notes.  Pertinent labs & imaging results that were available during my care of the patient were reviewed by me and considered in my medical decision making (see chart for details).  The patient is well-appearing, he is in no acute distress, vital signs are stable.  Symptoms appear to be consistent with acute sinusitis.  Will treat with Augmentin 875/125 mg tablets, fluticasone 50 micro nasal spray for sinus pain and pressure, and Bromfed-DM for the cough.  Supportive care recommendations were provided and discussed with the patient to include over-the-counter analgesics, normal saline nasal spray, and use of  a humidifier in the bedroom at nighttime during sleep.  Patient was advised to follow-up in this clinic or with his PCP if symptoms do not improve with this treatment.  Patient is in agreement with this plan of care and verbalizes understanding.  All questions were answered.  Patient stable for discharge.  Final Clinical Impressions(s) / UC Diagnoses   Final diagnoses:  Acute sinusitis, recurrence not specified, unspecified location     Discharge Instructions      Take medication as directed. Increase fluids and get plenty of rest. May take over-the-counter ibuprofen or Tylenol as needed for pain, fever, or general discomfort. Recommend normal saline nasal spray to help with nasal congestion throughout the day. For your cough, it may be helpful to use a humidifier at bedtime during sleep. If symptoms do not improve after completing this treatment, or worsen, please follow-up in this clinic or with your primary care physician for further evaluation. Follow-up as needed.     ED Prescriptions     Medication Sig Dispense Auth. Provider   amoxicillin-clavulanate (AUGMENTIN) 875-125 MG tablet Take 1 tablet by mouth every 12 (twelve) hours. 14 tablet Asli Tokarski-Warren, Sadie Haber, NP   fluticasone (FLONASE) 50 MCG/ACT nasal spray Place 2 sprays into both nostrils daily. 16 g Kaisyn Millea-Warren, Sadie Haber, NP   brompheniramine-pseudoephedrine-DM 30-2-10 MG/5ML syrup Take 5 mLs by mouth 4 (four) times daily as needed. 140 mL Lawayne Hartig-Warren, Sadie Haber, NP      PDMP not reviewed this encounter.   Abran Cantor, NP 05/12/23 1030

## 2023-05-12 NOTE — Discharge Instructions (Signed)
Take medication as directed. Increase fluids and get plenty of rest. May take over-the-counter ibuprofen or Tylenol as needed for pain, fever, or general discomfort. Recommend normal saline nasal spray to help with nasal congestion throughout the day. For your cough, it may be helpful to use a humidifier at bedtime during sleep. If symptoms do not improve after completing this treatment, or worsen, please follow-up in this clinic or with your primary care physician for further evaluation. Follow-up as needed.
